# Patient Record
Sex: Female | Born: 1960 | Race: White | Hispanic: No | Marital: Married | Smoking: Former smoker
Health system: Southern US, Community
[De-identification: ages and names within clinical notes are randomized; demographics above are authoritative.]

## PROBLEM LIST (undated history)

## (undated) DIAGNOSIS — E782 Mixed hyperlipidemia: Secondary | ICD-10-CM

## (undated) DIAGNOSIS — I779 Disorder of arteries and arterioles, unspecified: Secondary | ICD-10-CM

## (undated) DIAGNOSIS — I10 Essential (primary) hypertension: Secondary | ICD-10-CM

## (undated) HISTORY — DX: Essential (primary) hypertension: I10

## (undated) HISTORY — DX: Mixed hyperlipidemia: E78.2

## (undated) HISTORY — DX: Disorder of arteries and arterioles, unspecified: I77.9

## (undated) HISTORY — PX: NASAL SEPTUM SURGERY: SHX37

---

## 2015-02-07 ENCOUNTER — Encounter (INDEPENDENT_AMBULATORY_CARE_PROVIDER_SITE_OTHER): Payer: Self-pay | Admitting: *Deleted

## 2015-03-02 ENCOUNTER — Telehealth (INDEPENDENT_AMBULATORY_CARE_PROVIDER_SITE_OTHER): Payer: Self-pay | Admitting: *Deleted

## 2015-03-02 ENCOUNTER — Other Ambulatory Visit (INDEPENDENT_AMBULATORY_CARE_PROVIDER_SITE_OTHER): Payer: Self-pay | Admitting: Internal Medicine

## 2015-03-02 ENCOUNTER — Ambulatory Visit (INDEPENDENT_AMBULATORY_CARE_PROVIDER_SITE_OTHER): Payer: BLUE CROSS/BLUE SHIELD | Admitting: Internal Medicine

## 2015-03-02 ENCOUNTER — Encounter (INDEPENDENT_AMBULATORY_CARE_PROVIDER_SITE_OTHER): Payer: Self-pay | Admitting: Internal Medicine

## 2015-03-02 VITALS — BP 104/60 | HR 76 | Temp 98.6°F | Ht 60.0 in | Wt 143.1 lb

## 2015-03-02 DIAGNOSIS — Z1211 Encounter for screening for malignant neoplasm of colon: Secondary | ICD-10-CM | POA: Diagnosis not present

## 2015-03-02 NOTE — Patient Instructions (Signed)
The risks and benefits such as perforation, bleeding, and infection were reviewed with the patient and is agreeable. 

## 2015-03-02 NOTE — Progress Notes (Signed)
   Subjective:    Patient ID: Kristy SarinCarolyn Clabaugh, female    DOB: [birth year], 54 y.o.   MRN: [id number]  HPI Referred to our office by Dr. Neita CarpSasser for scree Appetite is good. No weight loss. No abdominal pain.  No dysphagia. She usually has a BM daily. No melena or BRRB. No family hx of colon cancer. No NSAIDs She has never undergone a colonoscopy.    Review of Systems No past medical history on file.  Past Surgical History  Procedure Laterality Date  . Cesarean section      x 2  . Nasal septum surgery      Allergies  Allergen Reactions  . Shellfish Allergy     hives    No current outpatient prescriptions on file prior to visit.   No current facility-administered medications on file prior to visit.        Objective:   Physical ExamBlood pressure 104/60, pulse 76, temperature 98.6 F (37 C), height 5' (1.524 m), weight 143 lb 1.6 oz (64.91 kg). Alert and oriented. Skin warm and dry. Oral mucosa is moist.   . Sclera anicteric, conjunctivae is pink. Thyroid not enlarged. No cervical lymphadenopathy. Lungs clear. Heart regular rate and rhythm.  Abdomen is soft. Bowel sounds are positive. No hepatomegaly. No abdominal masses felt. No tenderness.  No edema to lower extremities.        Assessment & Plan:  Screening colonoscopy. No GI problems. The risks and benefits such as perforation, bleeding, and infection were reviewed with the patient and is agreeable.

## 2015-03-02 NOTE — Telephone Encounter (Signed)
Patient needs suprep 

## 2015-03-13 ENCOUNTER — Ambulatory Visit (INDEPENDENT_AMBULATORY_CARE_PROVIDER_SITE_OTHER): Payer: Self-pay | Admitting: Internal Medicine

## 2015-03-14 ENCOUNTER — Other Ambulatory Visit (INDEPENDENT_AMBULATORY_CARE_PROVIDER_SITE_OTHER): Payer: Self-pay | Admitting: Internal Medicine

## 2015-03-14 MED ORDER — NA SULFATE-K SULFATE-MG SULF 17.5-3.13-1.6 GM/177ML PO SOLN: 177.0000 mL | Freq: Once | ORAL | Status: DC

## 2015-03-14 NOTE — Telephone Encounter (Signed)
Ann resend this

## 2015-03-14 NOTE — Addendum Note (Signed)
Addended by: Luster LandsbergILLEY, Larence Thone H on: 03/14/2015 03:08 PM   Modules accepted: Orders

## 2015-03-17 ENCOUNTER — Telehealth (INDEPENDENT_AMBULATORY_CARE_PROVIDER_SITE_OTHER): Payer: Self-pay | Admitting: *Deleted

## 2015-03-17 DIAGNOSIS — Z1211 Encounter for screening for malignant neoplasm of colon: Secondary | ICD-10-CM

## 2015-03-17 MED ORDER — PEG 3350-KCL-NA BICARB-NACL 420 G PO SOLR: 4000.0000 mL | Freq: Once | ORAL | Status: DC

## 2015-03-17 NOTE — Telephone Encounter (Signed)
Patient needs trilyte, suprep too expensive 

## 2015-04-06 ENCOUNTER — Encounter (HOSPITAL_COMMUNITY): Payer: Self-pay | Admitting: *Deleted

## 2015-04-06 ENCOUNTER — Encounter (HOSPITAL_COMMUNITY)
Admission: RE | Disposition: A | Discharge status: Home / Self Care | Payer: Self-pay | Source: Ambulatory Visit | Attending: Internal Medicine

## 2015-04-06 ENCOUNTER — Ambulatory Visit (HOSPITAL_COMMUNITY)
Admission: RE | Admit: 2015-04-06 | Discharge: 2015-04-06 | Disposition: A | Discharge status: Home / Self Care | Payer: BLUE CROSS/BLUE SHIELD | Source: Ambulatory Visit | Attending: Internal Medicine | Admitting: Internal Medicine

## 2015-04-06 DIAGNOSIS — K648 Other hemorrhoids: Secondary | ICD-10-CM | POA: Diagnosis not present

## 2015-04-06 DIAGNOSIS — K644 Residual hemorrhoidal skin tags: Secondary | ICD-10-CM | POA: Diagnosis not present

## 2015-04-06 DIAGNOSIS — F17210 Nicotine dependence, cigarettes, uncomplicated: Secondary | ICD-10-CM | POA: Insufficient documentation

## 2015-04-06 DIAGNOSIS — Z8371 Family history of colonic polyps: Secondary | ICD-10-CM | POA: Diagnosis not present

## 2015-04-06 DIAGNOSIS — Z1211 Encounter for screening for malignant neoplasm of colon: Secondary | ICD-10-CM | POA: Insufficient documentation

## 2015-04-06 HISTORY — PX: COLONOSCOPY: SHX5424

## 2015-04-06 SURGERY — COLONOSCOPY
Anesthesia: Moderate Sedation

## 2015-04-06 MED ORDER — MIDAZOLAM HCL 5 MG/5ML IJ SOLN
INTRAMUSCULAR | Status: AC
  Filled 2015-04-06: qty 10

## 2015-04-06 MED ORDER — SODIUM CHLORIDE 0.9 % IV SOLN
INTRAVENOUS | Status: DC
  Administered 2015-04-06: 14:00:00 via INTRAVENOUS

## 2015-04-06 MED ORDER — MIDAZOLAM HCL 5 MG/5ML IJ SOLN
INTRAMUSCULAR | Status: DC | PRN
Start: 2015-04-06 — End: 2015-04-06
  Administered 2015-04-06: 2 mg via INTRAVENOUS
  Administered 2015-04-06: 1 mg via INTRAVENOUS
  Administered 2015-04-06 (×2): 2 mg via INTRAVENOUS

## 2015-04-06 MED ORDER — MEPERIDINE HCL 50 MG/ML IJ SOLN
INTRAMUSCULAR | Status: AC
  Filled 2015-04-06: qty 1

## 2015-04-06 MED ORDER — MEPERIDINE HCL 50 MG/ML IJ SOLN
INTRAMUSCULAR | Status: DC | PRN
  Administered 2015-04-06 (×2): 25 mg via INTRAVENOUS

## 2015-04-06 NOTE — Op Note (Signed)
COLONOSCOPY PROCEDURE REPORT  PATIENT:  Kristy Heath  MR#:  [id number] Birthdate:  1960/09/13, 54 y.o., female Endoscopist:  Dr. Malissa HippoNajeeb U. Ignatius Kloos, MD Referred By:  Dr. Estanislado PandyPaul W Sasser, MD  Procedure Date: 04/06/2015  Procedure:   Colonoscopy  Indications:  Patient is [age] old Caucasian female was undergoing average risk screening colonoscopy. Family history is negative for colorectal carcinoma. However 2 sisters and mother has had colonic polyps.  Informed Consent:  The procedure and risks were reviewed with the patient and informed consent was obtained.  Medications:  Demerol 50 mg IV Versed 7 mg IV  Description of procedure:  After a digital rectal exam was performed, that colonoscope was advanced from the anus through the rectum and colon to the area of the cecum, ileocecal valve and appendiceal orifice. The cecum was deeply intubated. These structures were well-seen and photographed for the record. From the level of the cecum and ileocecal valve, the scope was slowly and cautiously withdrawn. The mucosal surfaces were carefully surveyed utilizing scope tip to flexion to facilitate fold flattening as needed. The scope was pulled down into the rectum where a thorough exam including retroflexion was performed.  Findings:   Prep excellent. Normal mucosa of cecum, ascending colon, hepatic flexure, transverse colon, splenic flexure, descending and sigmoid colon. Normal rectal mucosa. Small hemorrhoids above and below the dentate line.   Therapeutic/Diagnostic Maneuvers Performed:   None  Complications:  None  EBL: None  Cecal Withdrawal Time:  9 minutes  Impression:  Examination performed to cecum. Internal and external hemorrhoids otherwise normal colonoscopy.  Recommendations:  Standard instructions given. Next colonoscopy in 5 years unless family history changes.  Camara Rosander U  04/06/2015 3:08 PM  CC: Dr. Estanislado PandySASSER,PAUL W, MD & Dr. Bonnetta BarryNo ref. provider found

## 2015-04-06 NOTE — Discharge Instructions (Signed)
Resume usual medications and diet. No driving for 24 hours. Next colonoscopy in 10 years unless family history changes.   Colonoscopy, Care After These instructions give you information on caring for yourself after your procedure. Your doctor may also give you more specific instructions. Call your doctor if you have any problems or questions after your procedure. HOME CARE  Do not drive for 24 hours.  Do not sign important papers or use machinery for 24 hours.  You may shower.  You may go back to your usual activities, but go slower for the first 24 hours.  Take rest breaks often during the first 24 hours.  Walk around or use warm packs on your belly (abdomen) if you have belly cramping or gas.  Drink enough fluids to keep your pee (urine) clear or pale yellow.  Resume your normal diet. Avoid heavy or fried foods.  Avoid drinking alcohol for 24 hours or as told by your doctor.  Only take medicines as told by your doctor. If a tissue sample (biopsy) was taken during the procedure:   Do not take aspirin or blood thinners for 7 days, or as told by your doctor.  Do not drink alcohol for 7 days, or as told by your doctor.  Eat soft foods for the first 24 hours. GET HELP IF: You still have a small amount of blood in your poop (stool) 2-3 days after the procedure. GET HELP RIGHT AWAY IF:  You have more than a small amount of blood in your poop.  You see clumps of tissue (blood clots) in your poop.  Your belly is puffy (swollen).  You feel sick to your stomach (nauseous) or throw up (vomit).  You have a fever.  You have belly pain that gets worse and medicine does not help. MAKE SURE YOU:  Understand these instructions.  Will watch your condition.  Will get help right away if you are not doing well or get worse.   This information is not intended to replace advice given to you by your health care provider. Make sure you discuss any questions you have with your health  care provider.   Document Released: 06/08/2010 Document Revised: 05/11/2013 Document Reviewed: 01/11/2013 Elsevier Interactive Patient Education [employer]2016 Elsevier Inc.

## 2015-04-06 NOTE — H&P (Signed)
Kristy SarinCarolyn Heath is an 54 y.o. female.   Chief Complaint: Patient is here for colonoscopy. HPI: This 54 year old Caucasian female who is here for screening colonoscopy. She denies abdominal pain change in bowel habits or rectal bleeding. Family history significant for colonic polyps in mother and 2 sisters. One sister is older and the other one is younger. Family history is negative for CRC.  History reviewed. No pertinent past medical history.  Past Surgical History  Procedure Laterality Date  . Cesarean section      x 2  . Nasal septum surgery      Family History  Problem Relation Age of Onset  . Heart disease Father   . Hypertension Brother    Social History:  reports that she has been smoking Cigarettes.  She has been smoking about 0.50 packs per day. She does not have any smokeless tobacco history on file. She reports that she does not drink alcohol or use illicit drugs.  Allergies:  Allergies  Allergen Reactions  . Shellfish Allergy     hives    Medications Prior to Admission  Medication Sig Dispense Refill  . cetirizine (ZYRTEC) 10 MG chewable tablet Chew 10 mg by mouth daily.    . Flaxseed, Linseed, (FLAX SEED OIL PO) Take by mouth.    Marland Kitchen. omeprazole (PRILOSEC) 20 MG capsule Take 20 mg by mouth daily.    . polyethylene glycol-electrolytes (NULYTELY/GOLYTELY) 420 G solution Take 4,000 mLs by mouth once. 4000 mL 0  . Red Yeast Rice Extract (RED YEAST RICE PO) Take by mouth.    . Na Sulfate-K Sulfate-Mg Sulf (SUPREP BOWEL PREP) SOLN Take 177 mLs by mouth once. 1171 mL 0    No results found for this or any previous visit (from the past 48 hour(s)). No results found.  ROS  Blood pressure 126/86, pulse 106, temperature 98.6 F (37 C), temperature source Oral, resp. rate 16, height 5' (1.524 m), weight 140 lb (63.504 kg), SpO2 100 %. Physical Exam  Constitutional: She appears well-developed and well-nourished.  HENT:  Mouth/Throat: Oropharynx is clear and moist.  Eyes:  Conjunctivae are normal. No scleral icterus.  Neck: No thyromegaly present.  Cardiovascular: Normal rate, regular rhythm and normal heart sounds.   No murmur heard. Respiratory: Effort normal and breath sounds normal.  GI: Soft. She exhibits no distension and no mass. There is no tenderness.  Musculoskeletal: She exhibits no edema.  Lymphadenopathy:    She has no cervical adenopathy.  Neurological: She is alert.  Skin: Skin is warm and dry.     Assessment/Plan Average risk screening colonoscopy. Family history of colonic polyps in 2 siblings and mother.  Kristy Heath U 04/06/2015, 2:36 PM

## 2015-04-07 ENCOUNTER — Encounter (INDEPENDENT_AMBULATORY_CARE_PROVIDER_SITE_OTHER): Payer: Self-pay

## 2015-04-12 ENCOUNTER — Encounter (HOSPITAL_COMMUNITY): Payer: Self-pay | Admitting: Internal Medicine

## 2016-05-02 ENCOUNTER — Encounter: Payer: Self-pay | Admitting: Vascular Surgery

## 2016-05-08 ENCOUNTER — Ambulatory Visit (INDEPENDENT_AMBULATORY_CARE_PROVIDER_SITE_OTHER): Payer: BLUE CROSS/BLUE SHIELD | Admitting: Vascular Surgery

## 2016-05-08 ENCOUNTER — Encounter: Payer: Self-pay | Admitting: Vascular Surgery

## 2016-05-08 VITALS — BP 132/84 | HR 88 | Temp 97.6°F | Resp 14 | Ht 60.0 in | Wt 133.0 lb

## 2016-05-08 DIAGNOSIS — R011 Cardiac murmur, unspecified: Secondary | ICD-10-CM

## 2016-05-08 DIAGNOSIS — I771 Stricture of artery: Secondary | ICD-10-CM | POA: Diagnosis not present

## 2016-05-08 NOTE — Progress Notes (Signed)
New Carotid Patient  Referred by:  Lianne Moris, PA-C [address] [place], [place] [address]  Reason for referral: left subclavian artery stenosis   History of Present Illness  Kristy Heath is a 55 y.o. (10-Dec-1960) female who presents with chief complaint: "some type of neck blockage".  This patient reported had a physical exam which resulted in discovery of a left side carotid bruit.  A carotid duplex was then obtained.  Previous carotid studies demonstrated: RICA <50% stenosis, LICA <50% stenosis, and partial retrograde flow in left vertebral artery.  Patient has no history of TIA or stroke symptom.  The patient has never had amaurosis fugax or monocular blindness.  The patient has never had facial drooping or hemiplegia.  The patient has never had receptive or expressive aphasia.   The patient denies any left arm claudication equivalent symptoms.  She does have occasional sx of left hand anesthesia after sleeping on that arm.  The patient's atherosclerotic risks factors include: reported HLD, active smoking.  Past Medical History: Tobacco abuse Hyperlipidemia Post-menopausal Left subclavian artery stenosis  Past Surgical History:  Procedure Laterality Date  . CESAREAN SECTION     x 2  . COLONOSCOPY N/A 04/06/2015   Procedure: COLONOSCOPY;  Surgeon: Malissa Hippo, MD;  [facility];  Service: Endoscopy;  Laterality: N/A;  1:00  . NASAL SEPTUM SURGERY      Social History   Social History  . Marital status: Married    Spouse name: N/A  . Number of children: N/A  . Years of education: N/A   Occupational History  . Not on file.   Social History Main Topics  . Smoking status: Former Smoker    Packs/day: 0.50    Types: Cigarettes    Quit date: 04/07/2016  . Smokeless tobacco: Never Used     Comment: less 1/2 pack x 20 yrs  . Alcohol use No  . Drug use: No  . Sexual activity: Not on file   Other Topics Concern  . Not on file   Social History Narrative  .  No narrative on file    Family History  Problem Relation Age of Onset  . Heart disease Father   . Hypertension Brother     Current Outpatient Prescriptions  Medication Sig Dispense Refill  . cetirizine (ZYRTEC) 10 MG chewable tablet Chew 10 mg by mouth daily.    . CHANTIX STARTING MONTH PAK 0.5 MG X 11 & 1 MG X 42 tablet     . Flaxseed, Linseed, (FLAX SEED OIL PO) Take by mouth.    Marland Kitchen omeprazole (PRILOSEC) 20 MG capsule Take 20 mg by mouth daily.    Marland Kitchen PREMARIN vaginal cream     . Red Yeast Rice Extract (RED YEAST RICE PO) Take by mouth.     No current facility-administered medications for this visit.     Allergies  Allergen Reactions  . Shellfish Allergy     hives     REVIEW OF SYSTEMS:   Cardiac:  positive for: no symptoms, negative for: Chest pain or chest pressure, Shortness of breath upon exertion and Shortness of breath when lying flat,   Vascular:  positive for: no symptoms,  negative for: Pain in calf, thigh, or hip brought on by ambulation, Pain in feet at night that wakes you up from your sleep, Blood clot in your veins and Leg swelling  Pulmonary:  positive for: no symptoms,  negative for: Oxygen at home, Productive cough and  Wheezing  Neurologic:  positive for: No symptoms, negative for: Sudden weakness in arms or legs, Sudden numbness in arms or legs, Sudden onset of difficulty speaking or slurred speech, Temporary loss of vision in one eye and Problems with dizziness  Gastrointestinal:  positive for: no symptoms, negative for: Blood in stool and Vomited blood  Genitourinary:  positive for: no symptoms, negative for: Burning when urinating and Blood in urine  Psychiatric:  positive for: no symptoms,  negative for: Major depression  Hematologic:  positive for: no symptoms,  negative for: negative for: Bleeding problems and Problems with blood clotting too easily  Dermatologic:  positive for: no symptoms, negative for: Rashes or  ulcers  Constitutional:  positive for: no symptoms, negative for: Fever or chills  Ear/Nose/Throat:  positive for: no symptoms, negative for: Change in hearing, Nose bleeds and Sore throat  Musculoskeletal:  positive for: no symptoms, negative for: Back pain, Joint pain and Muscle pain   Physical Examination  Vitals:   05/08/16 0927 05/08/16 0933  BP: 126/89 132/84  Pulse: 88 88  Resp: 14   Temp: 97.6 F (36.4 C)   SpO2: 100%   Weight: 133 lb (60.3 kg)   Height: 5' (1.524 m)     Body mass index is 25.97 kg/m.  General: Alert, O x 3, WD,NAD  Head: /AT,   Ear/Nose/Throat: Hearing grossly intact, nares without erythema or drainage, oropharynx without Erythema or Exudate , Mallampati score: 3, Dentition intact  Eyes: PERRLA, EOMI,   Neck: Supple, mid-line trachea,    Pulmonary: Sym exp, good B air movt,CTA B  Cardiac: RRR, Nl S1, S2, + murmur, No rubs, No S3,S4  Vascular: Vessel Right Left  Radial Palpable Palpable  Brachial Palpable Palpable  Carotid Palpable, No Bruit Palpable, No Bruit  Aorta Not palpable N/A  Femoral Palpable Palpable  Popliteal Not palpable Not palpable  PT Palpable Palpable  DP Palpable Palpable   Gastrointestinal: soft, non-distended, non-tender to palpation, No guarding or rebound, no HSM, no masses, no CVAT B, No palpable prominent aortic pulse,    Musculoskeletal: M/S 5/5 throughout  , Extremities without ischemic changes  , No edema present, No LDS present  Neurologic: CN 2-12 intact , Pain and light touch intact in extremities , Motor exam as listed above  Psychiatric: Judgement intact, Mood & affect appropriate for pt's clinical situation  Dermatologic: See M/S exam for extremity exam, No rashes otherwise noted  Lymph : Palpable lymph nodes: None   Outside Studies/Documentation 5 pages of outside documents were reviewed including: PCP charts and outside B carotid duplex   Medical Decision Making  Kristy Heath is  a 55 y.o. female who presents with: asx possible L SCA stenosis, heart murmur   The patient's L carotid murmur is likely a transmitted heart murmur.  I will defer further work-up on the heart murmur to her PCP.  The patient does not have lifestyle limiting sx so even if she has some degree of L SCA stenosis, no intervention is needed.  BP differentially is only 6 mm Hg between both arms.  Based on the patient's vascular studies and examination, I have offered the patient: repeat B carotid duplex in 6 months to see if flow reversal is seen in L vertebral artery.  I discussed in depth with the patient the nature of atherosclerosis, and emphasized the importance of maximal medical management including strict control of blood pressure, blood glucose, and lipid levels, obtaining regular exercise, antiplatelet agents, and cessation of smoking.  The patient is currently not on a statin.  She is going to resume her red yeast extract and follow up with her PCP to see if she needs to formally start a statin. The patient is currently starting ASA.  The patient is aware that without maximal medical management the underlying atherosclerotic disease process will progress, limiting the benefit of any interventions.  Thank you for allowing us to participate in this patient's care.   Leonides SakeBrian Jayden Kratochvil, MD, FACS [facility] Office: [phone] Pager: [phone]  05/08/2016, 11:57 AM

## 2016-05-08 NOTE — Addendum Note (Signed)
Addended by: Burton ApleyPETTY, Sandhya Denherder A on: 05/08/2016 02:59 PM   Modules accepted: Orders

## 2016-05-09 ENCOUNTER — Encounter: Payer: Self-pay | Admitting: Family Medicine

## 2016-07-29 ENCOUNTER — Ambulatory Visit (INDEPENDENT_AMBULATORY_CARE_PROVIDER_SITE_OTHER): Payer: BLUE CROSS/BLUE SHIELD | Admitting: Cardiovascular Disease

## 2016-07-29 ENCOUNTER — Encounter: Payer: Self-pay | Admitting: Cardiovascular Disease

## 2016-07-29 VITALS — BP 120/74 | HR 99 | Ht 60.0 in | Wt 138.0 lb

## 2016-07-29 DIAGNOSIS — Z716 Tobacco abuse counseling: Secondary | ICD-10-CM

## 2016-07-29 DIAGNOSIS — I779 Disorder of arteries and arterioles, unspecified: Secondary | ICD-10-CM

## 2016-07-29 DIAGNOSIS — I739 Peripheral vascular disease, unspecified: Secondary | ICD-10-CM

## 2016-07-29 DIAGNOSIS — R011 Cardiac murmur, unspecified: Secondary | ICD-10-CM

## 2016-07-29 DIAGNOSIS — I771 Stricture of artery: Secondary | ICD-10-CM | POA: Diagnosis not present

## 2016-07-29 NOTE — Progress Notes (Signed)
CARDIOLOGY CONSULT NOTE  Patient ID: Kristy Heath MRN: [id number] DOB/AGE: 58-Aug-1962 58 y.o.  Admit date: (Not on file) Primary Physician: Estanislado Pandy, MD Referring Physician:   Reason for Consultation: subclavian artery stenosis  HPI: The patient is a 56 yr old woman with a history of tobacco abuse referred for the evaluation of a "murmur" and possible left subclavian artery stenosis.She underwent carotid artery Dopplers at [facility] on 04/09/16. This revealed less than 50% bilateral internal carotid artery stenosis. There was reversible of left vertebral artery flow with suggestion of significant left subclavian artery stenosis.  She then saw Dr. Imogene Burn, a vascular surgeon in [place] on 05/08/16. He reviewed the findings and noted that given her absence of symptoms and only a 6 mmHg discrepancy in blood pressure between both arms, there was no need for immediate intervention and ultrasound could be repeated in 6 months.  She then started taking aspirin daily. She takes red rice yeast extract.  She denies a history of myocardial infarction and stroke. She denies exertional chest pain and shortness of breath. She does not exercise. She has smoked a half pack of cigarettes daily since she was a teenager. She denies left arm pain and weakness. When she gardens and bends down and stands up too quickly she might get dizzy on occasion.  ECG performed in the office today which I personally reviewed demonstrates normal sinus rhythm with no ischemic ST segment or T-wave abnormalities, nor any arrhythmias.     Allergies  Allergen Reactions  . Shellfish Allergy     hives    Current Outpatient Prescriptions  Medication Sig Dispense Refill  . aspirin 81 MG chewable tablet Chew by mouth daily.    . cetirizine (ZYRTEC) 10 MG chewable tablet Chew 10 mg by mouth daily.    . Flaxseed, Linseed, (FLAX SEED OIL PO) Take by mouth.    Marland Kitchen omeprazole (PRILOSEC) 20 MG capsule  Take 20 mg by mouth daily.    . Red Yeast Rice Extract (RED YEAST RICE PO) Take by mouth.     No current facility-administered medications for this visit.     History reviewed. No pertinent past medical history.  Past Surgical History:  Procedure Laterality Date  . CESAREAN SECTION     x 2  . COLONOSCOPY N/A 04/06/2015   Procedure: COLONOSCOPY;  Surgeon: Malissa Hippo, MD;  [facility];  Service: Endoscopy;  Laterality: N/A;  1:00  . NASAL SEPTUM SURGERY      Social History   Social History  . Marital status: Married    Spouse name: N/A  . Number of children: N/A  . Years of education: N/A   Occupational History  . Not on file.   Social History Main Topics  . Smoking status: Former Smoker    Packs/day: 0.50    Types: Cigarettes    Start date: 07/29/1976  . Smokeless tobacco: Never Used     Comment: less 1/2 pack x 20 yrs  . Alcohol use No  . Drug use: No  . Sexual activity: Not on file   Other Topics Concern  . Not on file   Social History Narrative  . No narrative on file     No family history of premature CAD in 1st degree relatives.  Prior to Admission medications   Medication Sig Start Date End Date Taking? Authorizing Provider  aspirin 81 MG chewable tablet Chew by mouth daily.   Yes Historical Provider,  MD  cetirizine (ZYRTEC) 10 MG chewable tablet Chew 10 mg by mouth daily.   Yes Historical Provider, MD  Flaxseed, Linseed, (FLAX SEED OIL PO) Take by mouth.   Yes Historical Provider, MD  omeprazole (PRILOSEC) 20 MG capsule Take 20 mg by mouth daily.   Yes Historical Provider, MD  Red Yeast Rice Extract (RED YEAST RICE PO) Take by mouth.   Yes Historical Provider, MD     Review of systems complete and found to be negative unless listed above in HPI     Physical exam Blood pressure 120/74, pulse 99, height 5' (1.524 m), weight 138 lb (62.6 kg), SpO2 99 %. General: NAD Neck: No JVD, no thyromegaly or thyroid nodule.  Lungs: Clear to  auscultation bilaterally with normal respiratory effort. CV: Nondisplaced PMI. Regular rate and rhythm, normal S1/S2, no S3/S4, no murmur.  No peripheral edema.  Bilateral carotid bruits, left > right. +left suclavian bruit (left infraclavicular area).  Normal pedal pulses.  Abdomen: Soft, nontender, no distention.  Skin: Intact without lesions or rashes.  Neurologic: Alert and oriented x 3.  Psych: Normal affect. Extremities: No clubbing or cyanosis.  HEENT: Normal.   ECG: Most recent ECG reviewed.  Telemetry: Independently reviewed.  Labs:  No results found for: WBC, HGB, HCT, MCV, PLT No results for input(s): NA, K, CL, CO2, BUN, CREATININE, CALCIUM, PROT, BILITOT, ALKPHOS, ALT, AST, GLUCOSE in the last 168 hours.  Invalid input(s): LABALBU No results found for: CKTOTAL, CKMB, CKMBINDEX, TROPONINI No results found for: CHOL No results found for: HDL No results found for: LDLCALC No results found for: TRIG No results found for: CHOLHDL No results found for: LDLDIRECT       Studies: No results found.  ASSESSMENT AND PLAN:  1. Left subclavian artery stenosis: She is asymptomatic and denies left arm pain or weakness. She denies a history of chest pain and stroke. There was only a 6 mmHg discrepancy in blood pressures between both arms. I offered her the option of pursuing a CT angiogram to further characterize this lesion. I also told her she could follow up with vascular surgery at which time they could repeat an ultrasound if deemed necessary. She will think about it. She will continue aspirin 81 mg daily.  2. Bilateral carotid artery stenosis: This was mild with less than 50% bilateral internal artery stenosis. She is on aspirin and red rice yeast extract. This can be repeated in one or 2 years.  3. Tobacco abuse: Cessation counseling given for primary prevention of CV disease. She had been on Chantix in the past. (2 minutes).  Dispo: fu prn.   Signed: Prentice DockerSuresh Karron Goens,  M.D., F.A.C.C.  07/29/2016, 1:52 PM

## 2016-07-29 NOTE — Patient Instructions (Signed)
Your physician recommends that you schedule a follow-up appointment in: as needed with Dr Purvis SheffieldKoneswaran   Your physician recommends that you continue on your current medications as directed. Please refer to the Current Medication list given to you today.     Thank you for choosing [facility] !

## 2016-11-15 ENCOUNTER — Encounter (HOSPITAL_COMMUNITY): Payer: BLUE CROSS/BLUE SHIELD

## 2016-11-15 ENCOUNTER — Ambulatory Visit: Payer: BLUE CROSS/BLUE SHIELD | Admitting: Vascular Surgery

## 2016-12-10 ENCOUNTER — Encounter: Payer: Self-pay | Admitting: Vascular Surgery

## 2016-12-23 NOTE — Progress Notes (Signed)
Established Subclavian Patient   History of Present Illness   Kristy Heath is a 56 y.o. (January 09, 1961) female who presents with chief complaint: none.  Previous carotid studies demonstrated: RICA <50% stenosis, LICA 50% stenosis, and partial flow reversal in L [place].  Patient has no history of TIA or stroke symptom.  The patient has never had amaurosis fugax or monocular blindness.  The patient has never had facial drooping or hemiplegia.  The patient has never had receptive or expressive aphasia.    The patient denies any left arm claudication equivalent symptoms.  She does note asx hand grip strength, being very right hand dominant.  She does not have any sx that interfere with ADL or work.  The patient's PMH, PSH, SH, and FamHx are unchanged from 05/08/16.   Current Outpatient Prescriptions  Medication Sig Dispense Refill  . aspirin 81 MG chewable tablet Chew by mouth daily.    . cetirizine (ZYRTEC) 10 MG chewable tablet Chew 10 mg by mouth daily.    . Flaxseed, Linseed, (FLAX SEED OIL PO) Take by mouth.    Marland Kitchen omeprazole (PRILOSEC) 20 MG capsule Take 20 mg by mouth daily.    . Red Yeast Rice Extract (RED YEAST RICE PO) Take by mouth.     No current facility-administered medications for this visit.     On ROS today: no arm numbness, no changed in ability to execute ADL or work   Physical Examination   Vitals:   12/27/16 1117 12/27/16 1118  BP: 123/79 101/80  Pulse: 89   Resp: 18   Temp: 98.6 F (37 C)   TempSrc: Oral   SpO2: 97%   Weight: 132 lb 3.2 oz (60 kg)   Height: 5' (1.524 m)    Body mass index is 25.82 kg/m.  General Alert, O x 3, WD, NAD  Neck Supple, mid-line trachea,    Pulmonary Sym exp, good B air movt, CTA B  Cardiac RRR, Nl S1, S2, no Murmurs, No rubs, No S3,S4  Vascular Vessel Right Left  Radial Palpable Faintly palpable  Brachial Palpable Faintly palpable  Carotid Palpable, No Bruit Palpable, Bruit present: likely transmitted heart murmur    Aorta Not palpable N/A  Femoral Palpable Palpable  Popliteal Not palpable Not palpable  PT Palpable Palpable  DP Palpable Palpable    Gastro- intestinal soft, non-distended, non-tender to palpation, No guarding or rebound, no HSM, no masses, no CVAT B, No palpable prominent aortic pulse,    Musculo- skeletal M/S 5/5 throughout  , Extremities without ischemic changes    Neurologic Cranial nerves 2-12 intact , Pain and light touch intact in extremities , Motor exam as listed above    Non-Invasive Vascular Imaging   B Carotid Duplex (12/27/2016):   R ICA stenosis:  1-39%  R VA: patent and antegrade  L ICA stenosis:  1-39%  L VA: patent and bidirectional   Medical Decision Making   Kristy Heath is a 56 y.o. female who presents with: B asx ICA stenosis <50%, asx L SCA stenosis without any signs of vertebrobasilar disease   Today's gradiet 21 mm Hg between R and L arm, suggesting progression of L SCA stenosis given prior 6 mm Hg.  However, patient remains asx, so it is hard to justify intervention given the variable patency of subclavian artery stenting.  This patient has NO interest in considering subclavian-carotid transposition or bypass.  Based on the patient's vascular studies and examination, I have offered the patient: annual carotid  duplex.  I discussed in depth with the patient the nature of atherosclerosis, and emphasized the importance of maximal medical management including strict control of blood pressure, blood glucose, and lipid levels, antiplatelet agents, obtaining regular exercise, and cessation of smoking.    The patient is aware that without maximal medical management the underlying atherosclerotic disease process will progress, limiting the benefit of any interventions. The patient is currently taking red yeast in place of formal statin.  We discussed considering starting formal statin. The patient is currently on an anti-platelet: ASA.  Thank you for  allowing us to participate in this patient's care.   Leonides SakeBrian Addley Ballinger, MD, FACS [facility] [place] Office: [phone] Pager: [phone]

## 2016-12-27 ENCOUNTER — Encounter: Payer: Self-pay | Admitting: Vascular Surgery

## 2016-12-27 ENCOUNTER — Ambulatory Visit (HOSPITAL_COMMUNITY)
Admission: RE | Admit: 2016-12-27 | Discharge: 2016-12-27 | Disposition: A | Discharge status: Home / Self Care | Payer: BLUE CROSS/BLUE SHIELD | Source: Ambulatory Visit | Attending: Vascular Surgery | Admitting: Vascular Surgery

## 2016-12-27 ENCOUNTER — Ambulatory Visit (INDEPENDENT_AMBULATORY_CARE_PROVIDER_SITE_OTHER): Payer: BLUE CROSS/BLUE SHIELD | Admitting: Vascular Surgery

## 2016-12-27 VITALS — BP 101/80 | HR 89 | Temp 98.6°F | Resp 18 | Ht 60.0 in | Wt 132.2 lb

## 2016-12-27 DIAGNOSIS — I6523 Occlusion and stenosis of bilateral carotid arteries: Secondary | ICD-10-CM | POA: Insufficient documentation

## 2016-12-27 DIAGNOSIS — I771 Stricture of artery: Secondary | ICD-10-CM | POA: Diagnosis present

## 2016-12-27 LAB — VAS US CAROTID
LCCADDIAS: -31 cm/s
LEFT ECA DIAS: -29 cm/s
LICADSYS: -61 cm/s
Left CCA dist sys: -104 cm/s
Left CCA prox dias: 25 cm/s
Left CCA prox sys: 108 cm/s
Left ICA dist dias: -24 cm/s
Left ICA prox dias: -19 cm/s
Left ICA prox sys: -63 cm/s
RIGHT CCA MID DIAS: 20 cm/s
RIGHT ECA DIAS: -17 cm/s
RIGHT VERTEBRAL DIAS: -22 cm/s
Right CCA prox dias: 24 cm/s
Right CCA prox sys: 101 cm/s
Right cca dist sys: -64 cm/s

## 2017-01-08 NOTE — Addendum Note (Signed)
Addended by: Burton Apley A on: 01/08/2017 11:35 AM   Modules accepted: Orders

## 2017-10-03 DIAGNOSIS — K210 Gastro-esophageal reflux disease with esophagitis: Secondary | ICD-10-CM | POA: Diagnosis not present

## 2017-10-03 DIAGNOSIS — E7800 Pure hypercholesterolemia, unspecified: Secondary | ICD-10-CM | POA: Diagnosis not present

## 2017-10-03 DIAGNOSIS — E782 Mixed hyperlipidemia: Secondary | ICD-10-CM | POA: Diagnosis not present

## 2017-10-07 DIAGNOSIS — R0989 Other specified symptoms and signs involving the circulatory and respiratory systems: Secondary | ICD-10-CM | POA: Diagnosis not present

## 2017-10-07 DIAGNOSIS — K210 Gastro-esophageal reflux disease with esophagitis: Secondary | ICD-10-CM | POA: Diagnosis not present

## 2017-10-07 DIAGNOSIS — E782 Mixed hyperlipidemia: Secondary | ICD-10-CM | POA: Diagnosis not present

## 2017-10-07 DIAGNOSIS — Z1389 Encounter for screening for other disorder: Secondary | ICD-10-CM | POA: Diagnosis not present

## 2018-04-01 ENCOUNTER — Other Ambulatory Visit: Payer: Self-pay

## 2018-04-01 DIAGNOSIS — I771 Stricture of artery: Secondary | ICD-10-CM

## 2018-04-07 ENCOUNTER — Ambulatory Visit: Payer: BLUE CROSS/BLUE SHIELD | Admitting: Vascular Surgery

## 2018-04-07 ENCOUNTER — Encounter (HOSPITAL_COMMUNITY): Payer: BLUE CROSS/BLUE SHIELD

## 2018-04-09 DIAGNOSIS — Z0000 Encounter for general adult medical examination without abnormal findings: Secondary | ICD-10-CM | POA: Diagnosis not present

## 2018-04-13 DIAGNOSIS — M542 Cervicalgia: Secondary | ICD-10-CM | POA: Diagnosis not present

## 2018-04-13 DIAGNOSIS — R0989 Other specified symptoms and signs involving the circulatory and respiratory systems: Secondary | ICD-10-CM | POA: Diagnosis not present

## 2018-04-13 DIAGNOSIS — Z6826 Body mass index (BMI) 26.0-26.9, adult: Secondary | ICD-10-CM | POA: Diagnosis not present

## 2018-04-13 DIAGNOSIS — M79671 Pain in right foot: Secondary | ICD-10-CM | POA: Diagnosis not present

## 2018-04-13 DIAGNOSIS — E782 Mixed hyperlipidemia: Secondary | ICD-10-CM | POA: Diagnosis not present

## 2018-04-13 DIAGNOSIS — Z23 Encounter for immunization: Secondary | ICD-10-CM | POA: Diagnosis not present

## 2018-04-13 DIAGNOSIS — Z0000 Encounter for general adult medical examination without abnormal findings: Secondary | ICD-10-CM | POA: Diagnosis not present

## 2018-06-01 ENCOUNTER — Telehealth (HOSPITAL_COMMUNITY): Payer: Self-pay | Admitting: Surgery

## 2018-06-01 NOTE — Telephone Encounter (Signed)
Attempted to contact patient to confirm appointments for 06/02/2018

## 2018-06-02 ENCOUNTER — Encounter: Payer: Self-pay | Admitting: Vascular Surgery

## 2018-06-02 ENCOUNTER — Other Ambulatory Visit: Payer: Self-pay

## 2018-06-02 ENCOUNTER — Ambulatory Visit (HOSPITAL_COMMUNITY)
Admission: RE | Admit: 2018-06-02 | Discharge: 2018-06-02 | Disposition: A | Discharge status: Home / Self Care | Payer: Commercial Managed Care - PPO | Source: Ambulatory Visit | Attending: Vascular Surgery | Admitting: Vascular Surgery

## 2018-06-02 ENCOUNTER — Ambulatory Visit (INDEPENDENT_AMBULATORY_CARE_PROVIDER_SITE_OTHER): Payer: Commercial Managed Care - PPO | Admitting: Vascular Surgery

## 2018-06-02 VITALS — BP 128/85 | HR 86 | Resp 16 | Ht 60.0 in | Wt 132.0 lb

## 2018-06-02 DIAGNOSIS — I771 Stricture of artery: Secondary | ICD-10-CM | POA: Diagnosis not present

## 2018-06-02 NOTE — Progress Notes (Signed)
Patient name: Kristy Heath MRN: [id number] DOB: Feb 21, 1961 Sex: female  REASON FOR VISIT: Follow-up for carotid disease  HPI: Analyce Heath is a 58 y.o. female that presents for one-year follow-up for carotid disease.  She states Dr. Imogene Burn started following her about 3 to 4 years ago after her primary care doctor noticed a left carotid bruit.  On follow-up today she reports no issues over the last year including no TIA or strokelike symptoms.  She states she has never had a stroke in the past.  She does take an aspirin daily.  In addition reviewing Dr. Nicky Pugh notes there was some concern about subclavian artery disease of her left upper extremity due to a difference in blood pressure.  She denies any arm claudication, left arm pain, tissue loss, and/or passing out to suggest steal symptoms.  She does smoke 1/2 - 1 PPD.  History reviewed. No pertinent past medical history.  Past Surgical History:  Procedure Laterality Date  . CESAREAN SECTION     x 2  . COLONOSCOPY N/A 04/06/2015   Procedure: COLONOSCOPY;  Surgeon: Malissa Hippo, MD;  Location[facility];  Service: Endoscopy;  Laterality: N/A;  1:00  . NASAL SEPTUM SURGERY      Family History  Problem Relation Age of Onset  . Heart disease Father   . Hypertension Brother     SOCIAL HISTORY: Social History   Tobacco Use  . Smoking status: Former Smoker    Packs/day: 0.50    Types: Cigarettes    Start date: 07/29/1976  . Smokeless tobacco: Never Used  . Tobacco comment: less 1/2 pack x 20 yrs  Substance Use Topics  . Alcohol use: No    Alcohol/week: 0.0 standard drinks    Allergies  Allergen Reactions  . Shellfish Allergy     hives    Current Outpatient Medications  Medication Sig Dispense Refill  . aspirin 81 MG chewable tablet Chew by mouth daily.    . cetirizine (ZYRTEC) 10 MG chewable tablet Chew 10 mg by mouth daily.    Marland Kitchen conjugated estrogens (PREMARIN) vaginal cream Place 1 Applicatorful vaginally daily.      . Flaxseed, Linseed, (FLAX SEED OIL PO) Take by mouth.    . levothyroxine (SYNTHROID, LEVOTHROID) 50 MCG tablet Take 50 mcg by mouth daily before breakfast.    . omeprazole (PRILOSEC) 20 MG capsule Take 20 mg by mouth daily.    . Red Yeast Rice Extract (RED YEAST RICE PO) Take by mouth.     No current facility-administered medications for this visit.     REVIEW OF SYSTEMS:  [X]  denotes positive finding, [ ]  denotes negative finding Cardiac  Comments:  Chest pain or chest pressure:    Shortness of breath upon exertion:    Short of breath when lying flat:    Irregular heart rhythm:        Vascular    Pain in calf, thigh, or hip brought on by ambulation:    Pain in feet at night that wakes you up from your sleep:     Blood clot in your veins:    Leg swelling:         Pulmonary    Oxygen at home:    Productive cough:     Wheezing:         Neurologic    Sudden weakness in arms or legs:     Sudden numbness in arms or legs:     Sudden onset of difficulty  speaking or slurred speech:    Temporary loss of vision in one eye:     Problems with dizziness:         Gastrointestinal    Blood in stool:     Vomited blood:         Genitourinary    Burning when urinating:     Blood in urine:        Psychiatric    Major depression:         Hematologic    Bleeding problems:    Problems with blood clotting too easily:        Skin    Rashes or ulcers:        Constitutional    Fever or chills:      PHYSICAL EXAM: Vitals:   06/02/18 1521 06/02/18 1522  BP: (!) 141/90 128/85  Pulse: 86   Resp: 16   Weight: 132 lb (59.9 kg)   Height: 5' (1.524 m)     GENERAL: The patient is a well-nourished female, in no acute distress. The vital signs are documented above. CARDIAC: There is a regular rate and rhythm.  VASCULAR:  PULMONARY: There is good air exchange bilaterally without wheezing or rales. ABDOMEN: Soft and non-tender with normal pitched bowel sounds.  MUSCULOSKELETAL:  There are no major deformities or cyanosis. NEUROLOGIC: No focal weakness or paresthesias are detected. CN II-XII grossly intact. SKIN: There are no ulcers or rashes noted. PSYCHIATRIC: The patient has a normal affect.  DATA:   I reviewed her carotid duplex that shows no significant stenosis.  Assessment/Plan:  58 year old female previously followed by Dr. Imogene Burn for less than 50% bilateral carotid disease and concern for left subclavian stenosis vs occlusion.  She has had no significant progression of carotid disease on her duplex today and remains asymptomatic.  In addition, other than slightly weaker left radial pulse (compared to right) - no indication for intervention on previously suspected subclavian pathology (previously had partial flow reversal) -- antegrade flow in left vertebral artery on duplex today.  Will repeat duplex in one year and discussed importance of smoking cessation.    Cephus Shelling, MD [facility] Office: [phone] Pager: [phone]

## 2018-06-19 DIAGNOSIS — Z1231 Encounter for screening mammogram for malignant neoplasm of breast: Secondary | ICD-10-CM | POA: Diagnosis not present

## 2018-10-05 DIAGNOSIS — E782 Mixed hyperlipidemia: Secondary | ICD-10-CM | POA: Diagnosis not present

## 2018-10-13 ENCOUNTER — Other Ambulatory Visit (HOSPITAL_COMMUNITY): Payer: Self-pay | Admitting: Family Medicine

## 2018-10-13 ENCOUNTER — Other Ambulatory Visit: Payer: Self-pay | Admitting: Internal Medicine

## 2018-10-13 ENCOUNTER — Other Ambulatory Visit: Payer: Self-pay | Admitting: Family Medicine

## 2018-10-13 DIAGNOSIS — Z720 Tobacco use: Secondary | ICD-10-CM

## 2018-10-13 DIAGNOSIS — F17210 Nicotine dependence, cigarettes, uncomplicated: Secondary | ICD-10-CM

## 2018-10-27 ENCOUNTER — Other Ambulatory Visit: Payer: Self-pay

## 2018-10-27 ENCOUNTER — Ambulatory Visit (HOSPITAL_COMMUNITY)
Admission: RE | Admit: 2018-10-27 | Discharge: 2018-10-27 | Disposition: A | Discharge status: Home / Self Care | Payer: Commercial Managed Care - PPO | Source: Ambulatory Visit | Attending: Family Medicine | Admitting: Family Medicine

## 2018-10-27 DIAGNOSIS — Z720 Tobacco use: Secondary | ICD-10-CM | POA: Diagnosis present

## 2018-10-27 DIAGNOSIS — F17210 Nicotine dependence, cigarettes, uncomplicated: Secondary | ICD-10-CM | POA: Diagnosis present

## 2020-03-15 ENCOUNTER — Encounter (INDEPENDENT_AMBULATORY_CARE_PROVIDER_SITE_OTHER): Payer: Self-pay | Admitting: *Deleted

## 2021-08-13 ENCOUNTER — Other Ambulatory Visit (HOSPITAL_COMMUNITY): Payer: Self-pay | Admitting: Family Medicine

## 2021-08-13 DIAGNOSIS — Z1231 Encounter for screening mammogram for malignant neoplasm of breast: Secondary | ICD-10-CM

## 2021-08-22 ENCOUNTER — Ambulatory Visit (HOSPITAL_COMMUNITY)
Admission: RE | Admit: 2021-08-22 | Discharge: 2021-08-22 | Disposition: A | Discharge status: Home / Self Care | Payer: 59 | Source: Ambulatory Visit | Attending: Family Medicine | Admitting: Family Medicine

## 2021-08-22 DIAGNOSIS — Z1231 Encounter for screening mammogram for malignant neoplasm of breast: Secondary | ICD-10-CM | POA: Diagnosis not present

## 2022-04-25 DIAGNOSIS — E782 Mixed hyperlipidemia: Secondary | ICD-10-CM | POA: Diagnosis not present

## 2022-04-25 DIAGNOSIS — Z0001 Encounter for general adult medical examination with abnormal findings: Secondary | ICD-10-CM | POA: Diagnosis not present

## 2022-04-25 DIAGNOSIS — E7800 Pure hypercholesterolemia, unspecified: Secondary | ICD-10-CM | POA: Diagnosis not present

## 2022-04-25 DIAGNOSIS — E039 Hypothyroidism, unspecified: Secondary | ICD-10-CM | POA: Diagnosis not present

## 2022-04-25 DIAGNOSIS — K2100 Gastro-esophageal reflux disease with esophagitis, without bleeding: Secondary | ICD-10-CM | POA: Diagnosis not present

## 2022-05-21 DIAGNOSIS — Z23 Encounter for immunization: Secondary | ICD-10-CM | POA: Diagnosis not present

## 2022-05-24 ENCOUNTER — Inpatient Hospital Stay (HOSPITAL_COMMUNITY)
Admission: AC | Admit: 2022-05-24 | Discharge: 2022-05-27 | DRG: 322 | Disposition: A | Discharge status: Home / Self Care | Payer: 59 | Source: Other Acute Inpatient Hospital | Attending: Cardiovascular Disease | Admitting: Cardiovascular Disease

## 2022-05-24 ENCOUNTER — Encounter (HOSPITAL_COMMUNITY)
Admission: AC | Disposition: A | Discharge status: Home / Self Care | Payer: Self-pay | Source: Other Acute Inpatient Hospital | Attending: Internal Medicine

## 2022-05-24 DIAGNOSIS — R0600 Dyspnea, unspecified: Secondary | ICD-10-CM | POA: Diagnosis not present

## 2022-05-24 DIAGNOSIS — I069 Rheumatic aortic valve disease, unspecified: Secondary | ICD-10-CM | POA: Diagnosis present

## 2022-05-24 DIAGNOSIS — R079 Chest pain, unspecified: Secondary | ICD-10-CM | POA: Diagnosis not present

## 2022-05-24 DIAGNOSIS — E785 Hyperlipidemia, unspecified: Secondary | ICD-10-CM | POA: Diagnosis not present

## 2022-05-24 DIAGNOSIS — R69 Illness, unspecified: Secondary | ICD-10-CM | POA: Diagnosis not present

## 2022-05-24 DIAGNOSIS — R0789 Other chest pain: Secondary | ICD-10-CM | POA: Diagnosis not present

## 2022-05-24 DIAGNOSIS — Z743 Need for continuous supervision: Secondary | ICD-10-CM | POA: Diagnosis not present

## 2022-05-24 DIAGNOSIS — Z7982 Long term (current) use of aspirin: Secondary | ICD-10-CM | POA: Diagnosis not present

## 2022-05-24 DIAGNOSIS — T148XXA Other injury of unspecified body region, initial encounter: Secondary | ICD-10-CM | POA: Diagnosis not present

## 2022-05-24 DIAGNOSIS — Z8249 Family history of ischemic heart disease and other diseases of the circulatory system: Secondary | ICD-10-CM

## 2022-05-24 DIAGNOSIS — I25110 Atherosclerotic heart disease of native coronary artery with unstable angina pectoris: Secondary | ICD-10-CM | POA: Diagnosis not present

## 2022-05-24 DIAGNOSIS — I213 ST elevation (STEMI) myocardial infarction of unspecified site: Secondary | ICD-10-CM | POA: Diagnosis present

## 2022-05-24 DIAGNOSIS — F17210 Nicotine dependence, cigarettes, uncomplicated: Secondary | ICD-10-CM | POA: Diagnosis present

## 2022-05-24 DIAGNOSIS — I6523 Occlusion and stenosis of bilateral carotid arteries: Secondary | ICD-10-CM | POA: Diagnosis present

## 2022-05-24 DIAGNOSIS — R0689 Other abnormalities of breathing: Secondary | ICD-10-CM | POA: Diagnosis not present

## 2022-05-24 DIAGNOSIS — Z716 Tobacco abuse counseling: Secondary | ICD-10-CM | POA: Diagnosis not present

## 2022-05-24 DIAGNOSIS — S5011XA Contusion of right forearm, initial encounter: Secondary | ICD-10-CM | POA: Diagnosis not present

## 2022-05-24 DIAGNOSIS — Z87891 Personal history of nicotine dependence: Secondary | ICD-10-CM | POA: Diagnosis not present

## 2022-05-24 DIAGNOSIS — I959 Hypotension, unspecified: Secondary | ICD-10-CM | POA: Diagnosis not present

## 2022-05-24 DIAGNOSIS — I708 Atherosclerosis of other arteries: Secondary | ICD-10-CM | POA: Diagnosis not present

## 2022-05-24 DIAGNOSIS — Z91013 Allergy to seafood: Secondary | ICD-10-CM

## 2022-05-24 DIAGNOSIS — Z79890 Hormone replacement therapy (postmenopausal): Secondary | ICD-10-CM | POA: Diagnosis not present

## 2022-05-24 DIAGNOSIS — R110 Nausea: Secondary | ICD-10-CM | POA: Diagnosis not present

## 2022-05-24 DIAGNOSIS — Y840 Cardiac catheterization as the cause of abnormal reaction of the patient, or of later complication, without mention of misadventure at the time of the procedure: Secondary | ICD-10-CM | POA: Diagnosis not present

## 2022-05-24 DIAGNOSIS — I2119 ST elevation (STEMI) myocardial infarction involving other coronary artery of inferior wall: Principal | ICD-10-CM | POA: Diagnosis present

## 2022-05-24 DIAGNOSIS — Z79899 Other long term (current) drug therapy: Secondary | ICD-10-CM | POA: Diagnosis not present

## 2022-05-24 DIAGNOSIS — I2111 ST elevation (STEMI) myocardial infarction involving right coronary artery: Secondary | ICD-10-CM

## 2022-05-24 DIAGNOSIS — I2510 Atherosclerotic heart disease of native coronary artery without angina pectoris: Secondary | ICD-10-CM | POA: Diagnosis not present

## 2022-05-24 DIAGNOSIS — E7800 Pure hypercholesterolemia, unspecified: Secondary | ICD-10-CM | POA: Diagnosis not present

## 2022-05-24 DIAGNOSIS — M79603 Pain in arm, unspecified: Secondary | ICD-10-CM | POA: Diagnosis not present

## 2022-05-24 DIAGNOSIS — Z955 Presence of coronary angioplasty implant and graft: Secondary | ICD-10-CM

## 2022-05-24 DIAGNOSIS — I2583 Coronary atherosclerosis due to lipid rich plaque: Secondary | ICD-10-CM | POA: Diagnosis not present

## 2022-05-24 DIAGNOSIS — Z720 Tobacco use: Secondary | ICD-10-CM | POA: Insufficient documentation

## 2022-05-24 DIAGNOSIS — R230 Cyanosis: Secondary | ICD-10-CM | POA: Diagnosis not present

## 2022-05-24 HISTORY — PX: LEFT HEART CATH AND CORONARY ANGIOGRAPHY: CATH118249

## 2022-05-24 HISTORY — PX: CORONARY/GRAFT ACUTE MI REVASCULARIZATION: CATH118305

## 2022-05-24 LAB — POCT I-STAT, CHEM 8
BUN: 13 mg/dL (ref 8–23)
Calcium, Ion: 1.07 mmol/L — ABNORMAL LOW (ref 1.15–1.40)
Chloride: 98 mmol/L (ref 98–111)
Creatinine, Ser: 0.5 mg/dL (ref 0.44–1.00)
Glucose, Bld: 120 mg/dL — ABNORMAL HIGH (ref 70–99)
HCT: 32 % — ABNORMAL LOW (ref 36.0–46.0)
Hemoglobin: 10.9 g/dL — ABNORMAL LOW (ref 12.0–15.0)
Potassium: 3.2 mmol/L — ABNORMAL LOW (ref 3.5–5.1)
Sodium: 130 mmol/L — ABNORMAL LOW (ref 135–145)
TCO2: 19 mmol/L — ABNORMAL LOW (ref 22–32)

## 2022-05-24 SURGERY — CORONARY/GRAFT ACUTE MI REVASCULARIZATION
Anesthesia: LOCAL

## 2022-05-24 MED ORDER — SODIUM CHLORIDE 0.9 % IV SOLN: INTRAVENOUS | Status: AC

## 2022-05-24 MED ORDER — ASPIRIN 81 MG PO CHEW
81.0000 mg | CHEWABLE_TABLET | Freq: Every day | ORAL | Status: DC
  Administered 2022-05-25 – 2022-05-26 (×2): 81 mg via ORAL
  Filled 2022-05-24 (×2): qty 1

## 2022-05-24 MED ORDER — SODIUM CHLORIDE 0.9 % IV SOLN
INTRAVENOUS | Status: AC | PRN
  Administered 2022-05-24: 250 mL via INTRAVENOUS

## 2022-05-24 MED ORDER — TIROFIBAN HCL IN NACL 5-0.9 MG/100ML-% IV SOLN
0.1500 ug/kg/min | INTRAVENOUS | Status: DC
  Filled 2022-05-24: qty 100

## 2022-05-24 MED ORDER — VERAPAMIL HCL 2.5 MG/ML IV SOLN
INTRAVENOUS | Status: AC
  Filled 2022-05-24: qty 2

## 2022-05-24 MED ORDER — TIROFIBAN HCL IN NACL 5-0.9 MG/100ML-% IV SOLN
INTRAVENOUS | Status: AC | PRN
  Administered 2022-05-24: .15 ug/kg/min via INTRAVENOUS

## 2022-05-24 MED ORDER — SODIUM CHLORIDE 0.9% FLUSH
3.0000 mL | Freq: Two times a day (BID) | INTRAVENOUS | Status: DC
  Administered 2022-05-25 – 2022-05-26 (×4): 3 mL via INTRAVENOUS

## 2022-05-24 MED ORDER — NOREPINEPHRINE 4 MG/250ML-% IV SOLN
5.0000 ug/min | INTRAVENOUS | Status: DC
  Administered 2022-05-24: 5 ug/min via INTRAVENOUS

## 2022-05-24 MED ORDER — FENTANYL CITRATE (PF) 100 MCG/2ML IJ SOLN
INTRAMUSCULAR | Status: DC | PRN
  Administered 2022-05-24: 25 ug via INTRAVENOUS

## 2022-05-24 MED ORDER — HYDRALAZINE HCL 20 MG/ML IJ SOLN: 10.0000 mg | INTRAMUSCULAR | Status: AC | PRN

## 2022-05-24 MED ORDER — TIROFIBAN (AGGRASTAT) BOLUS VIA INFUSION
INTRAVENOUS | Status: DC | PRN
  Administered 2022-05-24: 1587.5 ug via INTRAVENOUS

## 2022-05-24 MED ORDER — ATORVASTATIN CALCIUM 80 MG PO TABS
80.0000 mg | ORAL_TABLET | Freq: Every day | ORAL | Status: DC
  Administered 2022-05-25 – 2022-05-26 (×2): 80 mg via ORAL
  Filled 2022-05-24 (×2): qty 1

## 2022-05-24 MED ORDER — NOREPINEPHRINE 4 MG/250ML-% IV SOLN
INTRAVENOUS | Status: AC
  Filled 2022-05-24: qty 250

## 2022-05-24 MED ORDER — NITROGLYCERIN 0.4 MG SL SUBL: 0.4000 mg | SUBLINGUAL_TABLET | SUBLINGUAL | Status: DC | PRN

## 2022-05-24 MED ORDER — IOHEXOL 350 MG/ML SOLN
INTRAVENOUS | Status: DC | PRN
  Administered 2022-05-24: 122 mL

## 2022-05-24 MED ORDER — SODIUM CHLORIDE 0.9% FLUSH: 3.0000 mL | INTRAVENOUS | Status: DC | PRN

## 2022-05-24 MED ORDER — TIROFIBAN HCL IN NACL 5-0.9 MG/100ML-% IV SOLN
INTRAVENOUS | Status: AC
  Filled 2022-05-24: qty 100

## 2022-05-24 MED ORDER — LEVOTHYROXINE SODIUM 50 MCG PO TABS
50.0000 ug | ORAL_TABLET | Freq: Every day | ORAL | Status: DC
  Administered 2022-05-25 – 2022-05-27 (×3): 50 ug via ORAL
  Filled 2022-05-24 (×3): qty 1

## 2022-05-24 MED ORDER — TICAGRELOR 90 MG PO TABS
ORAL_TABLET | ORAL | Status: AC
  Filled 2022-05-24: qty 2

## 2022-05-24 MED ORDER — TICAGRELOR 90 MG PO TABS
ORAL_TABLET | ORAL | Status: DC | PRN
  Administered 2022-05-24: 180 mg via ORAL

## 2022-05-24 MED ORDER — TICAGRELOR 90 MG PO TABS
90.0000 mg | ORAL_TABLET | Freq: Two times a day (BID) | ORAL | Status: DC
  Administered 2022-05-25 – 2022-05-27 (×5): 90 mg via ORAL
  Filled 2022-05-24 (×5): qty 1

## 2022-05-24 MED ORDER — NOREPINEPHRINE BITARTRATE 1 MG/ML IV SOLN
INTRAVENOUS | Status: AC | PRN
  Administered 2022-05-24: 10 ug/min via INTRAVENOUS

## 2022-05-24 MED ORDER — MIDAZOLAM HCL 2 MG/2ML IJ SOLN
INTRAMUSCULAR | Status: AC
  Filled 2022-05-24: qty 2

## 2022-05-24 MED ORDER — MIDAZOLAM HCL 2 MG/2ML IJ SOLN
INTRAMUSCULAR | Status: DC | PRN
  Administered 2022-05-24: 1 mg via INTRAVENOUS

## 2022-05-24 MED ORDER — ATROPINE SULFATE 1 MG/10ML IJ SOSY
PREFILLED_SYRINGE | INTRAMUSCULAR | Status: DC | PRN
  Administered 2022-05-24: .5 mg via INTRAVENOUS

## 2022-05-24 MED ORDER — HEPARIN (PORCINE) IN NACL 1000-0.9 UT/500ML-% IV SOLN
INTRAVENOUS | Status: AC
  Filled 2022-05-24: qty 500

## 2022-05-24 MED ORDER — LABETALOL HCL 5 MG/ML IV SOLN: 10.0000 mg | INTRAVENOUS | Status: AC | PRN

## 2022-05-24 MED ORDER — HEPARIN (PORCINE) IN NACL 1000-0.9 UT/500ML-% IV SOLN
INTRAVENOUS | Status: DC | PRN
  Administered 2022-05-24 (×2): 500 mL

## 2022-05-24 MED ORDER — HEPARIN SODIUM (PORCINE) 1000 UNIT/ML IJ SOLN
INTRAMUSCULAR | Status: AC
  Filled 2022-05-24: qty 10

## 2022-05-24 MED ORDER — ACETAMINOPHEN 325 MG PO TABS: 650.0000 mg | ORAL_TABLET | ORAL | Status: DC | PRN

## 2022-05-24 MED ORDER — SODIUM CHLORIDE 0.9 % IV SOLN: 250.0000 mL | INTRAVENOUS | Status: DC | PRN

## 2022-05-24 MED ORDER — HEPARIN (PORCINE) IN NACL 2-0.9 UNITS/ML
INTRAMUSCULAR | Status: DC | PRN
  Administered 2022-05-24: 10 mL via INTRA_ARTERIAL

## 2022-05-24 MED ORDER — HEPARIN SODIUM (PORCINE) 1000 UNIT/ML IJ SOLN
INTRAMUSCULAR | Status: DC | PRN
  Administered 2022-05-24: 3000 [IU] via INTRAVENOUS
  Administered 2022-05-24: 5000 [IU] via INTRAVENOUS
  Administered 2022-05-24 (×3): 2000 [IU] via INTRAVENOUS

## 2022-05-24 MED ORDER — ONDANSETRON HCL 4 MG/2ML IJ SOLN: 4.0000 mg | Freq: Four times a day (QID) | INTRAMUSCULAR | Status: DC | PRN

## 2022-05-24 MED ORDER — ATROPINE SULFATE 1 MG/10ML IJ SOSY
PREFILLED_SYRINGE | INTRAMUSCULAR | Status: AC
  Filled 2022-05-24: qty 10

## 2022-05-24 MED ORDER — FENTANYL CITRATE (PF) 100 MCG/2ML IJ SOLN
INTRAMUSCULAR | Status: AC
  Filled 2022-05-24: qty 2

## 2022-05-24 MED ORDER — MORPHINE SULFATE (PF) 2 MG/ML IV SOLN: 1.0000 mg | INTRAVENOUS | Status: DC | PRN

## 2022-05-24 MED ORDER — PANTOPRAZOLE SODIUM 40 MG PO TBEC
40.0000 mg | DELAYED_RELEASE_TABLET | Freq: Every day | ORAL | Status: DC
  Administered 2022-05-25 – 2022-05-26 (×2): 40 mg via ORAL
  Filled 2022-05-24 (×2): qty 1

## 2022-05-24 SURGICAL SUPPLY — 28 items
BALLN EMERGE MR 3.0X12 (BALLOONS) ×1
BALLN SAPPHIRE 2.5X12 (BALLOONS) ×1
BALLN ~~LOC~~ EMERGE MR 2.75X20 (BALLOONS) ×1
BALLN ~~LOC~~ EMERGE MR 3.0X12 (BALLOONS) ×1
BALLOON EMERGE MR 3.0X12 (BALLOONS) IMPLANT
BALLOON SAPPHIRE 2.5X12 (BALLOONS) IMPLANT
BALLOON ~~LOC~~ EMERGE MR 2.75X20 (BALLOONS) IMPLANT
BALLOON ~~LOC~~ EMERGE MR 3.0X12 (BALLOONS) IMPLANT
CATH DIAG 6FR PIGTAIL ANGLED (CATHETERS) IMPLANT
CATH INFINITI 6F FL3.5 (CATHETERS) IMPLANT
CATH LAUNCHER 6FR JR4 (CATHETERS) IMPLANT
CATH TELESCOPE 6F GEC (CATHETERS) IMPLANT
DEVICE RAD COMP TR BAND LRG (VASCULAR PRODUCTS) IMPLANT
GLIDESHEATH SLEND SS 6F .021 (SHEATH) IMPLANT
GUIDEWIRE VAS SION BLUE 190 (WIRE) IMPLANT
KIT ENCORE 26 ADVANTAGE (KITS) IMPLANT
KIT HEART LEFT (KITS) ×1 IMPLANT
PACK CARDIAC CATHETERIZATION (CUSTOM PROCEDURE TRAY) ×1 IMPLANT
STENT SYNERGY XD 2.50X38 (Permanent Stent) IMPLANT
STENT SYNERGY XD 3.0X12 (Permanent Stent) IMPLANT
SYNERGY XD 2.50X38 (Permanent Stent) ×1 IMPLANT
SYNERGY XD 3.0X12 (Permanent Stent) ×1 IMPLANT
TRANSDUCER W/STOPCOCK (MISCELLANEOUS) ×1 IMPLANT
TUBING CIL FLEX 10 FLL-RA (TUBING) ×1 IMPLANT
VALVE GUARDIAN II ~~LOC~~ HEMO (MISCELLANEOUS) IMPLANT
WIRE ASAHI PROWATER 180CM (WIRE) IMPLANT
WIRE EMERALD 3MM-J .035X260CM (WIRE) IMPLANT
WIRE HI TORQ WHISPER MS 190CM (WIRE) IMPLANT

## 2022-05-24 NOTE — H&P (Signed)
Cardiology Admission History and Physical   Patient ID: Kristy Heath MRN: [id number]; DOB: 08-21-1960   Admission date: 05/24/2022  PCP:  Manon Hilding, MD   [place] Providers Cardiologist:  None        Chief Complaint:  STEMI  Patient Profile:   Kristy Heath is a 62 y.o. female with PMH Carotid stenosis ( RICA <29% stenosis, LICA 79% stenosis, and partial flow reversal in L [place]), ?left subclavian stenosis, HLD  who is being seen 05/24/2022 for the evaluation of chest pain- Inferior STEMI.  History of Present Illness:   [name] is a 62 y.o. female with PMH Carotid stenosis ( RICA <89% stenosis, LICA 21% stenosis, and partial flow reversal in L [place]), ?left subclavian stenosis, HLD  who is being seen 05/24/2022 for the evaluation of chest pain- Inferior STEMI.  She presented to [facility] for chest pain, sweating, dizziness and not feeling well. Earlier today she did elipitical for 6-32mins and then started having weird feeling in the chest- which she cannot describe, then noted to have nausea, chest pain, sweating- didn't feel right and became dizzy as well- did not pass out. Given her symptoms- they went to [facility] where EKG showed inferior STEMI- activated and brought directly to the CATH lab  Patient was on Norepi when she came in. At OSH received aspirin, morphine, nitro and heparin gtt. CATH lab- s/p RCA stent x2, has mid LAD- staged PCI on Monday. Had distal RCA-PLB mild thrombus for which she was started on aggrastat Norepi- has been weaned to 46mcg  At the time of my visit- she was chest pain free, BP 108/70s EKG shows inferior ST elevation- persisting.   Current tobacco use:1/2 ppd FH: significant for premature CAD- father with MI, and multiple family members  No past medical history on file.  Past Surgical History:  Procedure Laterality Date   CESAREAN SECTION     x 2   COLONOSCOPY N/A 04/06/2015   Procedure: COLONOSCOPY;  Surgeon:  Rogene Houston, MD;  Location: [facility];  Service: Endoscopy;  Laterality: N/A;  1:00   NASAL SEPTUM SURGERY       Medications Prior to Admission: Prior to Admission medications   Medication Sig Start Date End Date Taking? Authorizing Provider  aspirin 81 MG chewable tablet Chew by mouth daily.    [provider]  cetirizine (ZYRTEC) 10 MG chewable tablet Chew 10 mg by mouth daily.    [provider]  conjugated estrogens (PREMARIN) vaginal cream Place 1 Applicatorful vaginally daily.    [provider]  Flaxseed, Linseed, (FLAX SEED OIL PO) Take by mouth.    [provider]  levothyroxine (SYNTHROID, LEVOTHROID) 50 MCG tablet Take 50 mcg by mouth daily before breakfast.    [provider]  omeprazole (PRILOSEC) 20 MG capsule Take 20 mg by mouth daily.    [provider]  Red Yeast Rice Extract (RED YEAST RICE PO) Take by mouth.    [provider]     Allergies:    Allergies  Allergen Reactions   Shellfish Allergy     hives    Social History:   Social History   Socioeconomic History   Marital status: Married    Spouse name: Not on file   Number of children: Not on file   Years of education: Not on file   Highest education level: Not on file  Occupational History   Not on file  Tobacco Use  Smoking status: Former    Packs/day: 0.50    Types: Cigarettes    Start date: 07/29/1976   Smokeless tobacco: Never   Tobacco comments:    less 1/2 pack x 20 yrs  Substance and Sexual Activity   Alcohol use: No    Alcohol/week: 0.0 standard drinks of alcohol   Drug use: No   Sexual activity: Not on file  Other Topics Concern   Not on file  Social History Narrative   Not on file   Social Determinants of Health   Financial Resource Strain: Not on file  Food Insecurity: Not on file  Transportation Needs: Not on file  Physical Activity: Not on file  Stress: Not on file  Social Connections: Not on file   Intimate Partner Violence: Not on file    Family History:   The patient's family history includes Heart disease in her father; Hypertension in her brother.    ROS:  Please see the history of present illness.  All other ROS reviewed and negative.     Physical Exam/Data:   Vitals:   05/24/22 2110 05/24/22 2135 05/24/22 2145 05/24/22 2200  BP: 107/74 109/82 138/88 134/85  Pulse: (!) 0 88 97 92  Resp:  14 15 14   Temp:    (!) 97.5 F (36.4 C)  TempSrc:    Oral  SpO2: 95% 99% 98% 99%  Weight:  60 kg      Intake/Output Summary (Last 24 hours) at 05/24/2022 2209 Last data filed at 05/24/2022 2200 Gross per 24 hour  Intake 810.19 ml  Output --  Net 810.19 ml      05/24/2022    9:35 PM 06/02/2018    3:21 PM 12/27/2016   11:17 AM  Last 3 Weights  Weight (lbs) 132 lb 4.4 oz 132 lb 132 lb 3.2 oz  Weight (kg) 60 kg 59.875 kg 59.966 kg     Body mass index is 25.83 kg/m.  General:  Well nourished, well developed, in no acute distress HEENT: normal Neck: no JVD Vascular: No carotid bruits; Distal pulses 2+ bilaterally   Cardiac:  normal S1, S2; RRR; no murmur  Lungs:  clear to auscultation bilaterally, no wheezing, rhonchi or rales  Abd: soft, nontender, no hepatomegaly  Ext: no edema Right arm- TR band in place Musculoskeletal:  No deformities, BUE and BLE strength normal and equal Skin: warm and dry  Neuro:  CNs 2-12 intact, no focal abnormalities noted Psych:  Normal affect    EKG:  inferior STEMI  Relevant CV Studies: -  Laboratory Data:  High Sensitivity Troponin:  No results for input(s): "TROPONINIHS" in the last 720 hours.    Chemistry Recent Labs  Lab 05/24/22 2015  NA 130*  K 3.2*  CL 98  GLUCOSE 120*  BUN 13  CREATININE 0.50    No results for input(s): "PROT", "ALBUMIN", "AST", "ALT", "ALKPHOS", "BILITOT" in the last 168 hours. Lipids No results for input(s): "CHOL", "TRIG", "HDL", "LABVLDL", "LDLCALC", "CHOLHDL" in the last 168  hours. Hematology Recent Labs  Lab 05/24/22 2015  HGB 10.9*  HCT 32.0*   Thyroid No results for input(s): "TSH", "FREET4" in the last 168 hours. BNPNo results for input(s): "BNP", "PROBNP" in the last 168 hours.  DDimer No results for input(s): "DDIMER" in the last 168 hours.   Radiology/Studies:  CARDIAC CATHETERIZATION  Result Date: 05/24/2022   Prox RCA lesion is 100% stenosed.   Mid LAD lesion is 85% stenosed.   A stent was successfully  placed.   Post intervention, there is a 0% residual stenosis. 1.  100% occluded proximal right coronary artery treated with 2 overlapping drug-eluting stents; due to distal embolization to the distal aspect of the RPLV system the patient will be treated with Aggrastat for 12 hours along with pain control. 2.  Residual high-grade mid LAD lesion will be staged in the coming days. 3.  LVEDP of 12 mmHg. Recommendation: Aspirin, Brilinta, and hold beta-blockade for at least 24 hours.  Continue Aggrastat for 18 hours.  Staged PCI of mid LAD on Monday.     Assessment and Plan:   Inferior STEMI 2. HLD 3. Carotid stenosis <50% carotid stenosis (RICA <46% stenosis, LICA 28% stenosis, and partial flow reversal in L [place]) Tobacco use Dx  Plan: - underwent emergent LHC: noted to have prox RCA occlusion s/p stenting/PCI. Has some distal PLB low flow- will treat with aggrastat IV for 18 hours - has residual mid LAD dx- will need staged PCI on Monday - continue DAPT: aspirin plus brilinta, atorva 80mg  daily.  Holding off on metoprolol- will initiate GDMT - Weaning Norepi- currently on 30mcg - Cardiac rehab referral and aggressive risk factor modification- quit smoking - check labs, A1c, lipid panel - Admit to Critical care -TR band monitoring - ECHO in am  Full code D/w Dr Ali Lowe  Risk Assessment/Risk Scores:    TIMI Risk Score for ST  Elevation MI:   The patient's TIMI risk score is 5, which indicates a 12.4% risk of all cause mortality at 30 days.         Severity of Illness: The appropriate patient status for this patient is INPATIENT. Inpatient status is judged to be reasonable and necessary in order to provide the required intensity of service to ensure the patient's safety. The patient's presenting symptoms, physical exam findings, and initial radiographic and laboratory data in the context of their chronic comorbidities is felt to place them at high risk for further clinical deterioration. Furthermore, it is not anticipated that the patient will be medically stable for discharge from the hospital within 2 midnights of admission.   * I certify that at the point of admission it is my clinical judgment that the patient will require inpatient hospital care spanning beyond 2 midnights from the point of admission due to high intensity of service, high risk for further deterioration and high frequency of surveillance required.*   For questions or updates, please contact Will Please consult [email or web address] for contact info under     Signed, Renae Fickle, MD  05/24/2022 10:09 PM

## 2022-05-25 ENCOUNTER — Inpatient Hospital Stay (HOSPITAL_COMMUNITY): Payer: 59

## 2022-05-25 DIAGNOSIS — I213 ST elevation (STEMI) myocardial infarction of unspecified site: Secondary | ICD-10-CM

## 2022-05-25 DIAGNOSIS — T148XXA Other injury of unspecified body region, initial encounter: Secondary | ICD-10-CM

## 2022-05-25 DIAGNOSIS — I25110 Atherosclerotic heart disease of native coronary artery with unstable angina pectoris: Secondary | ICD-10-CM

## 2022-05-25 DIAGNOSIS — Z716 Tobacco abuse counseling: Secondary | ICD-10-CM

## 2022-05-25 DIAGNOSIS — E785 Hyperlipidemia, unspecified: Secondary | ICD-10-CM

## 2022-05-25 LAB — BASIC METABOLIC PANEL
Anion gap: 9 (ref 5–15)
BUN: 10 mg/dL (ref 8–23)
CO2: 20 mmol/L — ABNORMAL LOW (ref 22–32)
Calcium: 8.2 mg/dL — ABNORMAL LOW (ref 8.9–10.3)
Chloride: 109 mmol/L (ref 98–111)
Creatinine, Ser: 0.75 mg/dL (ref 0.44–1.00)
GFR, Estimated: >60 mL/min (ref >60)
Glucose, Bld: 113 mg/dL — ABNORMAL HIGH (ref 70–99)
Potassium: 3.8 mmol/L (ref 3.5–5.1)
Sodium: 138 mmol/L (ref 135–145)

## 2022-05-25 LAB — ECHOCARDIOGRAM COMPLETE
AR max vel: 1.32 cm2
AV Area VTI: 1.38 cm2
AV Area mean vel: 1.38 cm2
AV Mean grad: 6 mmHg
AV Peak grad: 11.3 mmHg
Ao pk vel: 1.68 m/s
Area-P 1/2: 3.42 cm2
S' Lateral: 3.2 cm
Weight: 2116.42 oz

## 2022-05-25 LAB — CBC WITH DIFFERENTIAL/PLATELET
Abs Immature Granulocytes: 0.04 10*3/uL (ref 0.00–0.07)
Basophils Absolute: 0.1 10*3/uL (ref 0.0–0.1)
Basophils Relative: 1 %
Eosinophils Absolute: 0 10*3/uL (ref 0.0–0.5)
Eosinophils Relative: 0 %
HCT: 29.7 % — ABNORMAL LOW (ref 36.0–46.0)
Hemoglobin: 9.7 g/dL — ABNORMAL LOW (ref 12.0–15.0)
Immature Granulocytes: 1 %
Lymphocytes Relative: 22 %
Lymphs Abs: 1.9 10*3/uL (ref 0.7–4.0)
MCH: 30.6 pg (ref 26.0–34.0)
MCHC: 32.7 g/dL (ref 30.0–36.0)
MCV: 93.7 fL (ref 80.0–100.0)
Monocytes Absolute: 0.7 10*3/uL (ref 0.1–1.0)
Monocytes Relative: 9 %
Neutro Abs: 5.8 10*3/uL (ref 1.7–7.7)
Neutrophils Relative %: 67 %
Platelets: 275 10*3/uL (ref 150–400)
RBC: 3.17 MIL/uL — ABNORMAL LOW (ref 3.87–5.11)
RDW: 12.9 % (ref 11.5–15.5)
WBC: 8.5 10*3/uL (ref 4.0–10.5)
nRBC: 0 % (ref 0.0–0.2)

## 2022-05-25 LAB — MRSA NEXT GEN BY PCR, NASAL: MRSA by PCR Next Gen: NOT DETECTED

## 2022-05-25 LAB — LIPID PANEL
Cholesterol: 147 mg/dL (ref 0–200)
HDL: 30 mg/dL — ABNORMAL LOW (ref >40)
LDL Cholesterol: 56 mg/dL (ref 0–99)
Total CHOL/HDL Ratio: 4.9 RATIO
Triglycerides: 307 mg/dL — ABNORMAL HIGH (ref <150)
VLDL: 61 mg/dL — ABNORMAL HIGH (ref 0–40)

## 2022-05-25 LAB — HIV ANTIBODY (ROUTINE TESTING W REFLEX): HIV Screen 4th Generation wRfx: NONREACTIVE

## 2022-05-25 MED ORDER — CHLORHEXIDINE GLUCONATE CLOTH 2 % EX PADS
6.0000 | MEDICATED_PAD | Freq: Every day | CUTANEOUS | Status: DC
  Administered 2022-05-25 – 2022-05-26 (×2): 6 via TOPICAL

## 2022-05-25 MED ORDER — HEPARIN SODIUM (PORCINE) 5000 UNIT/ML IJ SOLN
5000.0000 [IU] | Freq: Three times a day (TID) | INTRAMUSCULAR | Status: DC
  Administered 2022-05-25 – 2022-05-27 (×5): 5000 [IU] via SUBCUTANEOUS
  Filled 2022-05-25 (×5): qty 1

## 2022-05-25 MED ORDER — TIROFIBAN HCL IN NACL 5-0.9 MG/100ML-% IV SOLN: 0.1500 ug/kg/min | INTRAVENOUS | Status: AC

## 2022-05-25 MED ORDER — SODIUM CHLORIDE 0.9% FLUSH
3.0000 mL | Freq: Two times a day (BID) | INTRAVENOUS | Status: DC
  Administered 2022-05-25 – 2022-05-26 (×3): 3 mL via INTRAVENOUS

## 2022-05-25 NOTE — Progress Notes (Signed)
RN reported that right hand/arm developed hematoma proximal to the TR band when he it was de-aired, and then patient developed petechiae, cyanosis of right arm.  I evaluated the patient at bedside.  Blood pressure stable, nor epinephrine down to 1 mcg -Right hand is cold, there is small hematoma proximal to TR band and ecchymosis and cyanosis of right arm -Doppler triphasic flow both proximal and distal right radial, collateral flow from ulnaris visible.  -Patient had already put cuff pressure for 20 minutes and also held manual pressure to decrease hematoma  -We moved 1 cc air from TR band -Will slowly remove air every hour, and watch for hematoma. Patient is currently on Aggrastat which complicates things.  If hematoma gets worse and I will stop Aggrastat.  Close monitoring on petechie, hopefully it will resolve once TR band area is removed.

## 2022-05-25 NOTE — Progress Notes (Signed)
Rounding Note    Patient Name: Kristy Heath Date of Encounter: 05/25/2022  [facility] Cardiologist: Orbie Pyo, MD   Subjective   Reviewed overnight events, has cath with PCI, developed R wrist hematoma at radial access site. Overnight had cyanosis, this AM has significant hematoma and petechiae but warm, sensation and perfusion intact.  Inpatient Medications    Scheduled Meds:  aspirin  81 mg Oral Daily   atorvastatin  80 mg Oral Daily   Chlorhexidine Gluconate Cloth  6 each Topical Daily   levothyroxine  50 mcg Oral QAC breakfast   pantoprazole  40 mg Oral Daily   sodium chloride flush  3 mL Intravenous Q12H   ticagrelor  90 mg Oral BID   Continuous Infusions:  sodium chloride     norepinephrine (LEVOPHED) Adult infusion Stopped (05/25/22 0720)   tirofiban 0.15 mcg/kg/min (05/25/22 0900)   PRN Meds: sodium chloride, acetaminophen, morphine injection, nitroGLYCERIN, ondansetron (ZOFRAN) IV, sodium chloride flush   Vital Signs    Vitals:   05/25/22 0645 05/25/22 0746 05/25/22 0800 05/25/22 0900  BP: 110/66  116/75 94/69  Pulse: 82  85 83  Resp: 12  13 14   Temp:  98.5 F (36.9 C)    TempSrc:  Oral    SpO2: 100%  98% 99%  Weight:        Intake/Output Summary (Last 24 hours) at 05/25/2022 1017 Last data filed at 05/25/2022 0900 Gross per 24 hour  Intake 2055.71 ml  Output 800 ml  Net 1255.71 ml      05/24/2022    9:35 PM 06/02/2018    3:21 PM 12/27/2016   11:17 AM  Last 3 Weights  Weight (lbs) 132 lb 4.4 oz 132 lb 132 lb 3.2 oz  Weight (kg) 60 kg 59.875 kg 59.966 kg      Telemetry    NSR with occasional ectopy - Personally Reviewed  ECG    05/25/22: NSR, inferior ST elevations resolved, inferior T wave inversions present - Personally Reviewed  Physical Exam   GEN: No acute distress.   Neck: No JVD Cardiac: RRR, no murmurs, rubs, or gallops.  Vascular: r radial site with distal hand petechiae, but normal capillary refill, normal  sensation, warm, palpable pulse. Proximal R forearm with firm hematoma nearly to elbow Respiratory: Clear to auscultation bilaterally. GI: Soft, nontender, non-distended  MS: No edema; No deformity. Neuro:  Nonfocal  Psych: Normal affect   Labs    High Sensitivity Troponin:  No results for input(s): "TROPONINIHS" in the last 720 hours.   Chemistry Recent Labs  Lab 05/24/22 2015 05/25/22 0615  NA 130* 138  K 3.2* 3.8  CL 98 109  CO2  --  20*  GLUCOSE 120* 113*  BUN 13 10  CREATININE 0.50 0.75  CALCIUM  --  8.2*  GFRNONAA  --  >60  ANIONGAP  --  9    Lipids  Recent Labs  Lab 05/25/22 0615  CHOL 147  TRIG 307*  HDL 30*  LDLCALC 56  CHOLHDL 4.9    Hematology Recent Labs  Lab 05/24/22 2015 05/25/22 0615  WBC  --  8.5  RBC  --  3.17*  HGB 10.9* 9.7*  HCT 32.0* 29.7*  MCV  --  93.7  MCH  --  30.6  MCHC  --  32.7  RDW  --  12.9  PLT  --  275   Thyroid No results for input(s): "TSH", "FREET4" in the last 168 hours.  BNPNo results for input(s): "BNP", "PROBNP" in the last 168 hours.  DDimer No results for input(s): "DDIMER" in the last 168 hours.   Radiology    CARDIAC CATHETERIZATION  Addendum Date: 05/25/2022     Prox RCA lesion is 100% stenosed.   Mid LAD lesion is 85% stenosed.   A stent was successfully placed.   Post intervention, there is a 0% residual stenosis. 1.  100% occluded proximal right coronary artery treated with 2 overlapping drug-eluting stents; due to distal embolization to the distal aspect of the RPLV system the patient will be treated with Aggrastat for 18 hours along with pain control. 2.  Residual high-grade mid LAD lesion will be staged in the coming days. 3.  LVEDP of 12 mmHg. Recommendation: Aspirin, Brilinta, and hold beta-blockade until norepinephrine weaned off.  Continue Aggrastat for 18 hours.  Staged PCI of mid LAD on Monday.  Addendum Date: 05/25/2022     Prox RCA lesion is 100% stenosed.   Mid LAD lesion is 85% stenosed.   A stent  was successfully placed.   Post intervention, there is a 0% residual stenosis. 1.  100% occluded proximal right coronary artery treated with 2 overlapping drug-eluting stents; due to distal embolization to the distal aspect of the RPLV system the patient will be treated with Aggrastat for 18 hours along with pain control. 2.  Residual high-grade mid LAD lesion will be staged in the coming days. 3.  LVEDP of 12 mmHg. Recommendation: Aspirin, Brilinta, and hold beta-blockade for at least 24 hours.  Continue Aggrastat for 18 hours.  Staged PCI of mid LAD on Monday.  Result Date: 05/25/2022   Prox RCA lesion is 100% stenosed.   Mid LAD lesion is 85% stenosed.   A stent was successfully placed.   Post intervention, there is a 0% residual stenosis. 1.  100% occluded proximal right coronary artery treated with 2 overlapping drug-eluting stents; due to distal embolization to the distal aspect of the RPLV system the patient will be treated with Aggrastat for 12 hours along with pain control. 2.  Residual high-grade mid LAD lesion will be staged in the coming days. 3.  LVEDP of 12 mmHg. Recommendation: Aspirin, Brilinta, and hold beta-blockade for at least 24 hours.  Continue Aggrastat for 18 hours.  Staged PCI of mid LAD on Monday.    Cardiac Studies   Cath 05/24/22   Prox RCA lesion is 100% stenosed.   Mid LAD lesion is 85% stenosed.   A stent was successfully placed.   Post intervention, there is a 0% residual stenosis.   1.  100% occluded proximal right coronary artery treated with 2 overlapping drug-eluting stents; due to distal embolization to the distal aspect of the RPLV system the patient will be treated with Aggrastat for 18 hours along with pain control. 2.  Residual high-grade mid LAD lesion will be staged in the coming days. 3.  LVEDP of 12 mmHg.   Recommendation: Aspirin, Brilinta, and hold beta-blockade until norepinephrine weaned off.  Continue Aggrastat for 18 hours.  Staged PCI of mid LAD on  Monday.  Patient Profile     62 y.o. female with PMH carotid stenosis, hyperlipidemia, current tobacco use, family history of CAD who was brought to cath lab 05/24/22 for inferior STEMI from [facility]. Course complicated by radial hematoma at access site while on aggrastat.  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 Was on red yeast rice prior to admission, started on atorvastatin this admission  Hypotension requiring IV pressors -weaning down norepinephrine  Radial hematoma after procedure -was on aggrastat at the time. Aggrastat now resumed -Hgb 10.9 on presentation, 9.7 this AM. Plts 275 -continue to monitor  CRITICAL CARE Patient is critically ill and requires high complexity decision making. Total critical care time: 40 minutes. This time includes gathering of history, evaluation of patient's response to treatment, examination of patient, review of laboratory and imaging studies, and coordination with consultants. Greater than 50% of time spent in direct patient care.   For questions or updates, please contact [place] Please consult [email or web address] for contact info under     Signed, Buford Dresser, MD  05/25/2022, 10:17 AM

## 2022-05-25 NOTE — Progress Notes (Signed)
CARDIAC REHAB PHASE I   PRE:  Rate/Rhythm: 99 SR    BP: sitting 108/64    SaO2: 97 2L  MODE:  Ambulation: 270 ft   POST:  Rate/Rhythm: 115 ST standing, 102 walking    BP: sitting 121/52     SaO2: 100 2L  Pt stood from recliner and walked unit, slow and steady. C/o slight sensation in lower chest, she relates to her lungs. Sts it is not pain, not worse with movement or inspiration. To BR then bed. VSS  Discussed with pt and husband MI, stents, restrictions, Brilinta and smoking cessation. Pt receptive, thinking about quitting smoking. Gave resources. Will f/u Monday or Tuesday.  [place] BS, ACSM-CEP 05/25/2022 12:33 PM

## 2022-05-25 NOTE — Progress Notes (Signed)
  Echocardiogram 2D Echocardiogram has been performed.  Kristy Heath 05/25/2022, 8:50 AM

## 2022-05-25 NOTE — Progress Notes (Signed)
ANTICOAGULATION CONSULT NOTE - Initial Consult  Pharmacy Consult for Aggrastat Indication: chest pain/ACS  Allergies  Allergen Reactions   Shellfish Allergy     hives    Patient Measurements: Weight: 60 kg (132 lb 4.4 oz)  Vital Signs: Temp: 99.1 F (37.3 C) (01/06 0300) Temp Source: Oral (01/06 0300) BP: 110/66 (01/06 0645) Pulse Rate: 82 (01/06 0645)  Labs: Recent Labs    05/24/22 2015 05/25/22 0615  HGB 10.9* 9.7*  HCT 32.0* 29.7*  PLT  --  275  CREATININE 0.50 0.75    CrCl cannot be calculated (Unknown ideal weight.).  Assessment: Patient presented from [facility] with inferior STEMI, underwent emergent LHC 1/5 treated with DES to RCA. Noted to have distal low flow, so pharmacy consulted to monitor CBC on Aggrastat. Patient has hematoma requiring Aggrastat to be held overnight, resumed 05/25/22 AM. Cards planning staged PCI to LAD Monday.  Hematoma is large but stable per RN. RN to monitor hematoma for expansion. Hgb 9.7 and platelet count WNL. Will continue to monitor platelets.  Plan:  Okay to continue Aggrastat per cards, will trend platelets.  Thank you for allowing pharmacy to participate in this patient's care.  Reatha Harps, PharmD PGY2 Pharmacy Resident 05/25/2022 7:30 AM Check [email or web address] for unit specific pharmacy number

## 2022-05-26 DIAGNOSIS — E7800 Pure hypercholesterolemia, unspecified: Secondary | ICD-10-CM

## 2022-05-26 DIAGNOSIS — I2583 Coronary atherosclerosis due to lipid rich plaque: Secondary | ICD-10-CM

## 2022-05-26 LAB — POCT ACTIVATED CLOTTING TIME
Activated Clotting Time: 266 seconds
Activated Clotting Time: 261 seconds
Activated Clotting Time: 358 seconds

## 2022-05-26 LAB — BASIC METABOLIC PANEL
Anion gap: 8 (ref 5–15)
BUN: 8 mg/dL (ref 8–23)
CO2: 23 mmol/L (ref 22–32)
Calcium: 8.8 mg/dL — ABNORMAL LOW (ref 8.9–10.3)
Chloride: 105 mmol/L (ref 98–111)
Creatinine, Ser: 0.83 mg/dL (ref 0.44–1.00)
GFR, Estimated: >60 mL/min (ref >60)
Glucose, Bld: 118 mg/dL — ABNORMAL HIGH (ref 70–99)
Potassium: 3.6 mmol/L (ref 3.5–5.1)
Sodium: 136 mmol/L (ref 135–145)

## 2022-05-26 LAB — CBC
HCT: 29.6 % — ABNORMAL LOW (ref 36.0–46.0)
Hemoglobin: 10.2 g/dL — ABNORMAL LOW (ref 12.0–15.0)
MCH: 30.8 pg (ref 26.0–34.0)
MCHC: 34.5 g/dL (ref 30.0–36.0)
MCV: 89.4 fL (ref 80.0–100.0)
Platelets: 262 10*3/uL (ref 150–400)
RBC: 3.31 MIL/uL — ABNORMAL LOW (ref 3.87–5.11)
RDW: 13 % (ref 11.5–15.5)
WBC: 6.6 10*3/uL (ref 4.0–10.5)
nRBC: 0 % (ref 0.0–0.2)

## 2022-05-26 MED ORDER — SODIUM CHLORIDE 0.9 % WEIGHT BASED INFUSION
3.0000 mL/kg/h | INTRAVENOUS | Status: AC
  Administered 2022-05-27: 3 mL/kg/h via INTRAVENOUS

## 2022-05-26 MED ORDER — METOPROLOL TARTRATE 12.5 MG HALF TABLET
12.5000 mg | ORAL_TABLET | Freq: Two times a day (BID) | ORAL | Status: DC
  Administered 2022-05-26 (×2): 12.5 mg via ORAL
  Filled 2022-05-26 (×2): qty 1

## 2022-05-26 MED ORDER — POTASSIUM CHLORIDE CRYS ER 20 MEQ PO TBCR
40.0000 meq | EXTENDED_RELEASE_TABLET | Freq: Once | ORAL | Status: AC
  Administered 2022-05-26: 40 meq via ORAL
  Filled 2022-05-26: qty 2

## 2022-05-26 MED ORDER — SODIUM CHLORIDE 0.9% FLUSH: 3.0000 mL | INTRAVENOUS | Status: DC | PRN

## 2022-05-26 MED ORDER — ALPRAZOLAM 0.25 MG PO TABS
0.2500 mg | ORAL_TABLET | Freq: Once | ORAL | Status: AC
  Administered 2022-05-26: 0.25 mg via ORAL
  Filled 2022-05-26: qty 1

## 2022-05-26 MED ORDER — ASPIRIN 81 MG PO CHEW
81.0000 mg | CHEWABLE_TABLET | ORAL | Status: AC
  Administered 2022-05-27: 81 mg via ORAL
  Filled 2022-05-26: qty 1

## 2022-05-26 MED ORDER — SODIUM CHLORIDE 0.9 % IV SOLN: 250.0000 mL | INTRAVENOUS | Status: DC | PRN

## 2022-05-26 MED ORDER — SODIUM CHLORIDE 0.9 % WEIGHT BASED INFUSION
1.0000 mL/kg/h | INTRAVENOUS | Status: DC
  Administered 2022-05-27: 1 mL/kg/h via INTRAVENOUS

## 2022-05-26 NOTE — Progress Notes (Addendum)
Rounding Note    Patient Name: Kristy Heath Date of Encounter: 05/26/2022  [place] Cardiologist: Early Osmond, MD   Subjective   No acute events overnight. Right forearm hematoma resolving. Anxious about procedure tomorrow but ready to move forward. No chest pain.  Inpatient Medications    Scheduled Meds:  aspirin  81 mg Oral Daily   atorvastatin  80 mg Oral Daily   Chlorhexidine Gluconate Cloth  6 each Topical Daily   heparin injection (subcutaneous)  5,000 Units Subcutaneous Q8H   levothyroxine  50 mcg Oral QAC breakfast   metoprolol tartrate  12.5 mg Oral BID   pantoprazole  40 mg Oral Daily   sodium chloride flush  3 mL Intravenous Q12H   sodium chloride flush  3 mL Intravenous Q12H   ticagrelor  90 mg Oral BID   Continuous Infusions:  sodium chloride     norepinephrine (LEVOPHED) Adult infusion Stopped (05/25/22 0720)   PRN Meds: sodium chloride, acetaminophen, morphine injection, nitroGLYCERIN, ondansetron (ZOFRAN) IV, sodium chloride flush   Vital Signs    Vitals:   05/26/22 0700 05/26/22 0800 05/26/22 0830 05/26/22 0900  BP: 121/80 129/74  128/73  Pulse: (!) 105 (!) 101 (!) 122 (!) 110  Resp: (!) 23 19 19 19   Temp:   98.9 F (37.2 C)   TempSrc:   Oral   SpO2: 95% 97% 95% 97%  Weight:        Intake/Output Summary (Last 24 hours) at 05/26/2022 0937 Last data filed at 05/26/2022 0900 Gross per 24 hour  Intake 424.8 ml  Output 775 ml  Net -350.2 ml      05/24/2022    9:35 PM 06/02/2018    3:21 PM 12/27/2016   11:17 AM  Last 3 Weights  Weight (lbs) 132 lb 4.4 oz 132 lb 132 lb 3.2 oz  Weight (kg) 60 kg 59.875 kg 59.966 kg      Telemetry    NSR/sinus tachycardia - Personally Reviewed  ECG   05/26/22: sinus tachycardia, inferior T wave inversions - Personally Reviewed  05/25/22: NSR, inferior ST elevations resolved, inferior T wave inversions present - Personally Reviewed  Physical Exam   GEN: Well nourished, well developed in no  acute distress NECK: No JVD CARDIAC: regular rhythm, normal S1 and S2, no rubs or gallops. No murmur. VASCULAR: Radial pulses 2+ bilaterally. R forearm hematoma softer, with ecchymoses starting to appear up to axilla RESPIRATORY:  Clear to auscultation without rales, wheezing or rhonchi  ABDOMEN: Soft, non-tender, non-distended MUSCULOSKELETAL:  Moves all 4 limbs independently SKIN: Warm and dry, no edema NEUROLOGIC:  No focal neuro deficits noted. PSYCHIATRIC:  Normal affect    Labs    High Sensitivity Troponin:  No results for input(s): "TROPONINIHS" in the last 720 hours.   Chemistry Recent Labs  Lab 05/24/22 2015 05/25/22 0615 05/26/22 0803  NA 130* 138 136  K 3.2* 3.8 3.6  CL 98 109 105  CO2  --  20* 23  GLUCOSE 120* 113* 118*  BUN 13 10 8   CREATININE 0.50 0.75 0.83  CALCIUM  --  8.2* 8.8*  GFRNONAA  --  >60 >60  ANIONGAP  --  9 8    Lipids  Recent Labs  Lab 05/25/22 0615  CHOL 147  TRIG 307*  HDL 30*  LDLCALC 56  CHOLHDL 4.9    Hematology Recent Labs  Lab 05/24/22 2015 05/25/22 0615 05/26/22 0803  WBC  --  8.5 6.6  RBC  --  3.17* 3.31*  HGB 10.9* 9.7* 10.2*  HCT 32.0* 29.7* 29.6*  MCV  --  93.7 89.4  MCH  --  30.6 30.8  MCHC  --  32.7 34.5  RDW  --  12.9 13.0  PLT  --  275 262   Thyroid No results for input(s): "TSH", "FREET4" in the last 168 hours.  BNPNo results for input(s): "BNP", "PROBNP" in the last 168 hours.  DDimer No results for input(s): "DDIMER" in the last 168 hours.   Radiology    ECHOCARDIOGRAM COMPLETE  Result Date: 05/25/2022    ECHOCARDIOGRAM REPORT   Patient Name:   Kristy Heath Date of Exam: 05/25/2022 Medical Rec #:  [id number]     Height:       60.0 in Accession #:    [id number]    Weight:       132.3 lb Date of Birth:  [birth year]     BSA:          1.566 m Patient Age:    62 years      BP:           110/66 mmHg Patient Gender: F             HR:           85 bpm. Exam Location:  Inpatient Procedure: 2D Echo, Cardiac Doppler and  Color Doppler Indications:    Acute myocardial infarction  History:        Patient has no prior history of Echocardiogram examinations.                 Signs/Symptoms:Chest Pain; Risk Factors:Dyslipidemia and Current                 Smoker.  Sonographer:    Clayton Lefort RDCS (AE) Referring Phys: Renae Fickle  Sonographer Comments: Suboptimal parasternal window. IMPRESSIONS  1. Poor acoustic windows  2. Left ventricular ejection fraction, by estimation, is 55 to 60%. The left ventricle has normal function. Left ventricular endocardial border not optimally defined to evaluate regional wall motion. Left ventricular diastolic parameters are consistent with Grade I diastolic dysfunction (impaired relaxation).  3. Right ventricular systolic function was not well visualized. The right ventricular size is not well visualized.  4. The mitral valve is abnormal. No evidence of mitral valve regurgitation. No evidence of mitral stenosis.  5. The aortic valve was not well visualized. There is moderate calcification of the aortic valve. Aortic valve regurgitation is not visualized. No aortic stenosis is present.  6. The inferior vena cava is normal in size with greater than 50% respiratory variability, suggesting right atrial pressure of 3 mmHg. Comparison(s): No prior Echocardiogram. FINDINGS  Left Ventricle: Left ventricular ejection fraction, by estimation, is 55 to 60%. The left ventricle has normal function. Left ventricular endocardial border not optimally defined to evaluate regional wall motion. The left ventricular internal cavity size was normal in size. There is no left ventricular hypertrophy. Left ventricular diastolic parameters are consistent with Grade I diastolic dysfunction (impaired relaxation). Right Ventricle: The right ventricular size is not well visualized. Right vetricular wall thickness was not well visualized. Right ventricular systolic function was not well visualized. Left Atrium: Left atrial size was  normal in size. Right Atrium: Right atrial size was normal in size. Pericardium: Trivial pericardial effusion is present. Mitral Valve: The mitral valve is abnormal. There is mild calcification of the mitral valve leaflet(s). Mild to moderate mitral annular calcification. No evidence of mitral valve regurgitation. No  evidence of mitral valve stenosis. Tricuspid Valve: The tricuspid valve is not well visualized. Tricuspid valve regurgitation is not demonstrated. No evidence of tricuspid stenosis. Aortic Valve: The aortic valve was not well visualized. There is moderate calcification of the aortic valve. Aortic valve regurgitation is not visualized. No aortic stenosis is present. Aortic valve mean gradient measures 6.0 mmHg. Aortic valve peak gradient measures 11.3 mmHg. Aortic valve area, by VTI measures 1.38 cm. Pulmonic Valve: The pulmonic valve was not well visualized. Pulmonic valve regurgitation is not visualized. No evidence of pulmonic stenosis. Aorta: The aortic root is normal in size and structure. Venous: The inferior vena cava is normal in size with greater than 50% respiratory variability, suggesting right atrial pressure of 3 mmHg. IAS/Shunts: The interatrial septum was not well visualized.  LEFT VENTRICLE PLAX 2D LVIDd:         4.50 cm   Diastology LVIDs:         3.20 cm   LV e' medial:    7.83 cm/s LV PW:         0.80 cm   LV E/e' medial:  9.8 LV IVS:        1.00 cm   LV e' lateral:   10.30 cm/s LVOT diam:     1.80 cm   LV E/e' lateral: 7.5 LV SV:         41 LV SV Index:   26 LVOT Area:     2.54 cm  RIGHT VENTRICLE            IVC RV Basal diam:  3.30 cm    IVC diam: 1.60 cm RV S prime:     9.03 cm/s LEFT ATRIUM           Index        RIGHT ATRIUM           Index LA diam:      2.70 cm 1.72 cm/m   RA Area:     12.00 cm LA Vol (A2C): 22.4 ml 14.31 ml/m  RA Volume:   26.90 ml  17.18 ml/m LA Vol (A4C): 16.9 ml 10.79 ml/m  AORTIC VALVE AV Area (Vmax):    1.32 cm AV Area (Vmean):   1.38 cm AV Area  (VTI):     1.38 cm AV Vmax:           168.00 cm/s AV Vmean:          111.000 cm/s AV VTI:            0.296 m AV Peak Grad:      11.3 mmHg AV Mean Grad:      6.0 mmHg LVOT Vmax:         87.40 cm/s LVOT Vmean:        60.400 cm/s LVOT VTI:          0.161 m LVOT/AV VTI ratio: 0.54  AORTA Ao Root diam: 3.20 cm MITRAL VALVE MV Area (PHT): 3.42 cm    SHUNTS MV Decel Time: 222 msec    Systemic VTI:  0.16 m MV E velocity: 77.10 cm/s  Systemic Diam: 1.80 cm MV A velocity: 78.10 cm/s MV E/A ratio:  0.99 Vishnu Priya Mallipeddi Electronically signed by Winfield RastVishnu Priya Mallipeddi Signature Date/Time: 05/25/2022/10:52:38 AM    Final    CARDIAC CATHETERIZATION  Addendum Date: 05/25/2022     Prox RCA lesion is 100% stenosed.   Mid LAD lesion is 85% stenosed.   A stent was successfully placed.  Post intervention, there is a 0% residual stenosis. 1.  100% occluded proximal right coronary artery treated with 2 overlapping drug-eluting stents; due to distal embolization to the distal aspect of the RPLV system the patient will be treated with Aggrastat for 18 hours along with pain control. 2.  Residual high-grade mid LAD lesion will be staged in the coming days. 3.  LVEDP of 12 mmHg. Recommendation: Aspirin, Brilinta, and hold beta-blockade until norepinephrine weaned off.  Continue Aggrastat for 18 hours.  Staged PCI of mid LAD on Monday.  Addendum Date: 05/25/2022     Prox RCA lesion is 100% stenosed.   Mid LAD lesion is 85% stenosed.   A stent was successfully placed.   Post intervention, there is a 0% residual stenosis. 1.  100% occluded proximal right coronary artery treated with 2 overlapping drug-eluting stents; due to distal embolization to the distal aspect of the RPLV system the patient will be treated with Aggrastat for 18 hours along with pain control. 2.  Residual high-grade mid LAD lesion will be staged in the coming days. 3.  LVEDP of 12 mmHg. Recommendation: Aspirin, Brilinta, and hold beta-blockade for at least 24  hours.  Continue Aggrastat for 18 hours.  Staged PCI of mid LAD on Monday.  Result Date: 05/25/2022   Prox RCA lesion is 100% stenosed.   Mid LAD lesion is 85% stenosed.   A stent was successfully placed.   Post intervention, there is a 0% residual stenosis. 1.  100% occluded proximal right coronary artery treated with 2 overlapping drug-eluting stents; due to distal embolization to the distal aspect of the RPLV system the patient will be treated with Aggrastat for 12 hours along with pain control. 2.  Residual high-grade mid LAD lesion will be staged in the coming days. 3.  LVEDP of 12 mmHg. Recommendation: Aspirin, Brilinta, and hold beta-blockade for at least 24 hours.  Continue Aggrastat for 18 hours.  Staged PCI of mid LAD on Monday.    Cardiac Studies   Echo 05/25/22  1. Poor acoustic windows   2. Left ventricular ejection fraction, by estimation, is 55 to 60%. The left ventricle has normal function. Left ventricular endocardial border not optimally defined to evaluate regional wall motion. Left ventricular  diastolic parameters are consistent with Grade I diastolic dysfunction (impaired relaxation).   3. Right ventricular systolic function was not well visualized. The right ventricular size is not well visualized.   4. The mitral valve is abnormal. No evidence of mitral valve regurgitation. No evidence of mitral stenosis.   5. The aortic valve was not well visualized. There is moderate calcification of the aortic valve. Aortic valve regurgitation is not visualized. No aortic stenosis is present.   6. The inferior vena cava is normal in size with greater than 50% respiratory variability, suggesting right atrial pressure of 3 mmHg.    Cath 05/24/22   Prox RCA lesion is 100% stenosed.   Mid LAD lesion is 85% stenosed.   A stent was successfully placed.   Post intervention, there is a 0% residual stenosis.   1.  100% occluded proximal right coronary artery treated with 2 overlapping drug-eluting  stents; due to distal embolization to the distal aspect of the RPLV system the patient will be treated with Aggrastat for 18 hours along with pain control. 2.  Residual high-grade mid LAD lesion will be staged in the coming days. 3.  LVEDP of 12 mmHg.   Recommendation: Aspirin, Brilinta, and hold beta-blockade until norepinephrine  weaned off.  Continue Aggrastat for 18 hours.  Staged PCI of mid LAD on Monday.  Patient Profile     62 y.o. female with PMH carotid stenosis, hyperlipidemia, current tobacco use, family history of CAD who was brought to cath lab 05/24/22 for inferior STEMI from [facility]. Course complicated by radial hematoma at access site while on aggrastat.  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 Was on red yeast rice prior to admission, started on atorvastatin this admission  Hypotension requiring IV pressors -off norepinephrine  Radial hematoma after procedure -was on aggrastat at the time.  -Hgb 10.9 -> 9.7 -> 10.2 -continue to monitor  Nurse reports that she did not sleep well, having some anxiety. OK to give a PRN this evening for sleep if needed.  Orders to transfer to the floor placed.  For questions or updates, please contact [place] Please consult [email or web address] for contact info under     Signed, Buford Dresser, MD  05/26/2022, 9:37 AM

## 2022-05-27 ENCOUNTER — Other Ambulatory Visit (HOSPITAL_COMMUNITY): Payer: Self-pay

## 2022-05-27 ENCOUNTER — Encounter (HOSPITAL_COMMUNITY)
Admission: AC | Disposition: A | Discharge status: Home / Self Care | Payer: Self-pay | Source: Other Acute Inpatient Hospital | Attending: Internal Medicine

## 2022-05-27 ENCOUNTER — Encounter (HOSPITAL_COMMUNITY): Payer: Self-pay | Admitting: Internal Medicine

## 2022-05-27 DIAGNOSIS — I959 Hypotension, unspecified: Secondary | ICD-10-CM | POA: Diagnosis not present

## 2022-05-27 DIAGNOSIS — I25110 Atherosclerotic heart disease of native coronary artery with unstable angina pectoris: Secondary | ICD-10-CM | POA: Diagnosis not present

## 2022-05-27 DIAGNOSIS — R69 Illness, unspecified: Secondary | ICD-10-CM | POA: Diagnosis not present

## 2022-05-27 DIAGNOSIS — E785 Hyperlipidemia, unspecified: Secondary | ICD-10-CM | POA: Insufficient documentation

## 2022-05-27 DIAGNOSIS — R230 Cyanosis: Secondary | ICD-10-CM | POA: Diagnosis not present

## 2022-05-27 DIAGNOSIS — I069 Rheumatic aortic valve disease, unspecified: Secondary | ICD-10-CM | POA: Diagnosis not present

## 2022-05-27 DIAGNOSIS — Z720 Tobacco use: Secondary | ICD-10-CM | POA: Insufficient documentation

## 2022-05-27 DIAGNOSIS — I708 Atherosclerosis of other arteries: Secondary | ICD-10-CM | POA: Diagnosis not present

## 2022-05-27 DIAGNOSIS — I6523 Occlusion and stenosis of bilateral carotid arteries: Secondary | ICD-10-CM | POA: Diagnosis not present

## 2022-05-27 DIAGNOSIS — I2111 ST elevation (STEMI) myocardial infarction involving right coronary artery: Secondary | ICD-10-CM | POA: Diagnosis not present

## 2022-05-27 DIAGNOSIS — Z79899 Other long term (current) drug therapy: Secondary | ICD-10-CM | POA: Diagnosis not present

## 2022-05-27 DIAGNOSIS — I2119 ST elevation (STEMI) myocardial infarction involving other coronary artery of inferior wall: Secondary | ICD-10-CM | POA: Diagnosis not present

## 2022-05-27 DIAGNOSIS — Z8249 Family history of ischemic heart disease and other diseases of the circulatory system: Secondary | ICD-10-CM | POA: Diagnosis not present

## 2022-05-27 DIAGNOSIS — I2510 Atherosclerotic heart disease of native coronary artery without angina pectoris: Secondary | ICD-10-CM

## 2022-05-27 DIAGNOSIS — S5011XA Contusion of right forearm, initial encounter: Secondary | ICD-10-CM | POA: Diagnosis not present

## 2022-05-27 HISTORY — PX: INTRAVASCULAR ULTRASOUND/IVUS: CATH118244

## 2022-05-27 HISTORY — PX: CORONARY STENT INTERVENTION: CATH118234

## 2022-05-27 HISTORY — PX: LEFT HEART CATH AND CORONARY ANGIOGRAPHY: CATH118249

## 2022-05-27 LAB — BASIC METABOLIC PANEL
Anion gap: 6 (ref 5–15)
BUN: 8 mg/dL (ref 8–23)
CO2: 24 mmol/L (ref 22–32)
Calcium: 8.8 mg/dL — ABNORMAL LOW (ref 8.9–10.3)
Chloride: 104 mmol/L (ref 98–111)
Creatinine, Ser: 0.8 mg/dL (ref 0.44–1.00)
GFR, Estimated: >60 mL/min (ref >60)
Glucose, Bld: 108 mg/dL — ABNORMAL HIGH (ref 70–99)
Potassium: 3.7 mmol/L (ref 3.5–5.1)
Sodium: 134 mmol/L — ABNORMAL LOW (ref 135–145)

## 2022-05-27 LAB — CBC
HCT: 31.6 % — ABNORMAL LOW (ref 36.0–46.0)
Hemoglobin: 10.4 g/dL — ABNORMAL LOW (ref 12.0–15.0)
MCH: 30 pg (ref 26.0–34.0)
MCHC: 32.9 g/dL (ref 30.0–36.0)
MCV: 91.1 fL (ref 80.0–100.0)
Platelets: 278 10*3/uL (ref 150–400)
RBC: 3.47 MIL/uL — ABNORMAL LOW (ref 3.87–5.11)
RDW: 12.8 % (ref 11.5–15.5)
WBC: 6 10*3/uL (ref 4.0–10.5)
nRBC: 0 % (ref 0.0–0.2)

## 2022-05-27 LAB — POCT ACTIVATED CLOTTING TIME
Activated Clotting Time: 244 seconds
Activated Clotting Time: 277 seconds
Activated Clotting Time: 201 seconds
Activated Clotting Time: 174 seconds

## 2022-05-27 LAB — HEMOGLOBIN A1C
Hgb A1c MFr Bld: 5.9 % — ABNORMAL HIGH (ref 4.8–5.6)
Mean Plasma Glucose: 123 mg/dL

## 2022-05-27 SURGERY — CORONARY STENT INTERVENTION
Anesthesia: LOCAL

## 2022-05-27 MED ORDER — HEPARIN (PORCINE) IN NACL 1000-0.9 UT/500ML-% IV SOLN
INTRAVENOUS | Status: AC
  Filled 2022-05-27: qty 500

## 2022-05-27 MED ORDER — LABETALOL HCL 5 MG/ML IV SOLN: 10.0000 mg | INTRAVENOUS | Status: AC | PRN

## 2022-05-27 MED ORDER — PANTOPRAZOLE SODIUM 40 MG PO TBEC
40.0000 mg | DELAYED_RELEASE_TABLET | Freq: Every day | ORAL | 0 refills | Status: DC
  Filled 2022-05-27: qty 30, 30d supply, fill #0

## 2022-05-27 MED ORDER — FENTANYL CITRATE (PF) 100 MCG/2ML IJ SOLN
INTRAMUSCULAR | Status: AC
  Filled 2022-05-27: qty 2

## 2022-05-27 MED ORDER — NITROGLYCERIN 1 MG/10 ML FOR IR/CATH LAB
INTRA_ARTERIAL | Status: AC
  Filled 2022-05-27: qty 10

## 2022-05-27 MED ORDER — ONDANSETRON HCL 4 MG/2ML IJ SOLN: 4.0000 mg | Freq: Four times a day (QID) | INTRAMUSCULAR | Status: DC | PRN

## 2022-05-27 MED ORDER — HEPARIN SODIUM (PORCINE) 1000 UNIT/ML IJ SOLN
INTRAMUSCULAR | Status: DC | PRN
  Administered 2022-05-27: 7000 [IU] via INTRAVENOUS

## 2022-05-27 MED ORDER — MIDAZOLAM HCL 2 MG/2ML IJ SOLN
INTRAMUSCULAR | Status: AC
  Filled 2022-05-27: qty 2

## 2022-05-27 MED ORDER — MIDAZOLAM HCL 2 MG/2ML IJ SOLN
INTRAMUSCULAR | Status: DC | PRN
  Administered 2022-05-27 (×2): 1 mg via INTRAVENOUS

## 2022-05-27 MED ORDER — LIDOCAINE HCL (PF) 1 % IJ SOLN
INTRAMUSCULAR | Status: DC | PRN
  Administered 2022-05-27: 10 mL

## 2022-05-27 MED ORDER — ASPIRIN 81 MG PO CHEW
81.0000 mg | CHEWABLE_TABLET | Freq: Every day | ORAL | 2 refills | Status: AC
  Filled 2022-05-27: qty 90, 90d supply, fill #0

## 2022-05-27 MED ORDER — SODIUM CHLORIDE 0.9% FLUSH: 3.0000 mL | INTRAVENOUS | Status: DC | PRN

## 2022-05-27 MED ORDER — METOPROLOL TARTRATE 25 MG PO TABS
12.5000 mg | ORAL_TABLET | Freq: Two times a day (BID) | ORAL | 1 refills | Status: DC
  Filled 2022-05-27: qty 30, 30d supply, fill #0

## 2022-05-27 MED ORDER — TICAGRELOR 90 MG PO TABS
90.0000 mg | ORAL_TABLET | Freq: Two times a day (BID) | ORAL | 1 refills | Status: DC
  Filled 2022-05-27: qty 60, 30d supply, fill #0

## 2022-05-27 MED ORDER — ATORVASTATIN CALCIUM 80 MG PO TABS
80.0000 mg | ORAL_TABLET | Freq: Every day | ORAL | 1 refills | Status: DC
  Filled 2022-05-27: qty 30, 30d supply, fill #0

## 2022-05-27 MED ORDER — SODIUM CHLORIDE 0.9 % IV SOLN: INTRAVENOUS | Status: AC

## 2022-05-27 MED ORDER — ACETAMINOPHEN 325 MG PO TABS: 650.0000 mg | ORAL_TABLET | ORAL | Status: DC | PRN

## 2022-05-27 MED ORDER — HYDRALAZINE HCL 20 MG/ML IJ SOLN: 10.0000 mg | INTRAMUSCULAR | Status: AC | PRN

## 2022-05-27 MED ORDER — HEPARIN (PORCINE) IN NACL 1000-0.9 UT/500ML-% IV SOLN
INTRAVENOUS | Status: DC | PRN
  Administered 2022-05-27 (×2): 500 mL

## 2022-05-27 MED ORDER — LIDOCAINE HCL (PF) 1 % IJ SOLN
INTRAMUSCULAR | Status: AC
  Filled 2022-05-27: qty 30

## 2022-05-27 MED ORDER — FENTANYL CITRATE (PF) 100 MCG/2ML IJ SOLN
INTRAMUSCULAR | Status: DC | PRN
  Administered 2022-05-27 (×4): 25 ug via INTRAVENOUS

## 2022-05-27 MED ORDER — SODIUM CHLORIDE 0.9 % IV SOLN: 250.0000 mL | INTRAVENOUS | Status: DC | PRN

## 2022-05-27 MED ORDER — IOHEXOL 350 MG/ML SOLN
INTRAVENOUS | Status: DC | PRN
  Administered 2022-05-27: 60 mL via INTRA_ARTERIAL

## 2022-05-27 MED ORDER — SODIUM CHLORIDE 0.9% FLUSH: 3.0000 mL | Freq: Two times a day (BID) | INTRAVENOUS | Status: DC

## 2022-05-27 MED ORDER — NITROGLYCERIN 0.4 MG SL SUBL
0.4000 mg | SUBLINGUAL_TABLET | SUBLINGUAL | 2 refills | Status: AC | PRN
  Filled 2022-05-27: qty 25, 7d supply, fill #0

## 2022-05-27 MED ORDER — TICAGRELOR 90 MG PO TABS: 90.0000 mg | ORAL_TABLET | Freq: Two times a day (BID) | ORAL | Status: DC

## 2022-05-27 MED ORDER — HEPARIN (PORCINE) IN NACL 1000-0.9 UT/500ML-% IV SOLN
INTRAVENOUS | Status: AC
  Filled 2022-05-27: qty 1000

## 2022-05-27 MED ORDER — ASPIRIN 81 MG PO CHEW: 81.0000 mg | CHEWABLE_TABLET | Freq: Every day | ORAL | Status: DC

## 2022-05-27 SURGICAL SUPPLY — 26 items
BALL SAPPHIRE NC24 2.75X15 (BALLOONS) ×1
BALLN EMERGE MR 2.0X15 (BALLOONS) ×1
BALLOON EMERGE MR 2.0X15 (BALLOONS) IMPLANT
BALLOON SAPPHIRE NC24 2.75X15 (BALLOONS) IMPLANT
CATH INFINITI 5FR MULTPACK ANG (CATHETERS) IMPLANT
CATH LAUNCHER 6FR EBU 3.5 SH (CATHETERS) IMPLANT
CATH OPTICROSS HD (CATHETERS) IMPLANT
ELECT DEFIB PAD ADLT CADENCE (PAD) IMPLANT
GUIDEWIRE INQWIRE 1.5J.035X260 (WIRE) IMPLANT
INQWIRE 1.5J .035X260CM (WIRE) ×1
KIT ENCORE 26 ADVANTAGE (KITS) IMPLANT
KIT HEART LEFT (KITS) ×1 IMPLANT
KIT MICROPUNCTURE NIT STIFF (SHEATH) IMPLANT
PACK CARDIAC CATHETERIZATION (CUSTOM PROCEDURE TRAY) ×1 IMPLANT
PROTECTION STATION PRESSURIZED (MISCELLANEOUS) ×1
SHEATH PINNACLE 6F 10CM (SHEATH) IMPLANT
SHEATH PROBE COVER 6X72 (BAG) IMPLANT
SLED PULL BACK IVUS (MISCELLANEOUS) IMPLANT
STATION PROTECTION PRESSURIZED (MISCELLANEOUS) IMPLANT
STENT SYNERGY XD 2.50X28 (Permanent Stent) IMPLANT
SYNERGY XD 2.50X28 (Permanent Stent) ×1 IMPLANT
TRANSDUCER W/STOPCOCK (MISCELLANEOUS) ×1 IMPLANT
TUBING CIL FLEX 10 FLL-RA (TUBING) ×1 IMPLANT
VALVE GUARDIAN II ~~LOC~~ HEMO (MISCELLANEOUS) IMPLANT
WIRE ASAHI PROWATER 180CM (WIRE) IMPLANT
WIRE EMERALD 3MM-J .035X150CM (WIRE) IMPLANT

## 2022-05-27 NOTE — Progress Notes (Signed)
Rounding Note    Patient Name: Kristy Heath Date of Encounter: 05/27/2022  [place] Cardiologist: Early Osmond, MD   Subjective   Doing well. Husband at bedside. No CP or dyspnea. Eager to have next procedure done. On for staged PCI of the LAD today.   Inpatient Medications    Scheduled Meds:  aspirin  81 mg Oral Daily   atorvastatin  80 mg Oral Daily   Chlorhexidine Gluconate Cloth  6 each Topical Daily   heparin injection (subcutaneous)  5,000 Units Subcutaneous Q8H   levothyroxine  50 mcg Oral QAC breakfast   metoprolol tartrate  12.5 mg Oral BID   pantoprazole  40 mg Oral Daily   sodium chloride flush  3 mL Intravenous Q12H   sodium chloride flush  3 mL Intravenous Q12H   ticagrelor  90 mg Oral BID   Continuous Infusions:  sodium chloride     sodium chloride     sodium chloride 1 mL/kg/hr (05/27/22 0800)   norepinephrine (LEVOPHED) Adult infusion Stopped (05/25/22 0720)   PRN Meds: sodium chloride, sodium chloride, acetaminophen, morphine injection, nitroGLYCERIN, ondansetron (ZOFRAN) IV, sodium chloride flush, sodium chloride flush   Vital Signs    Vitals:   05/26/22 1933 05/26/22 2353 05/27/22 0429 05/27/22 0720  BP: 118/65 90/70 95/64  100/67  Pulse: (!) 107 88 92 87  Resp: 20 16 15 16   Temp: 98.5 F (36.9 C) 98.8 F (37.1 C) 98.1 F (36.7 C) 98.1 F (36.7 C)  TempSrc: Oral Oral Oral Oral  SpO2: 98% 97% 98% 98%  Weight:      Height:    4\' 11"  (1.499 m)    Intake/Output Summary (Last 24 hours) at 05/27/2022 0855 Last data filed at 05/27/2022 0646 Gross per 24 hour  Intake 436.8 ml  Output 375 ml  Net 61.8 ml      05/24/2022    9:35 PM 06/02/2018    3:21 PM 12/27/2016   11:17 AM  Last 3 Weights  Weight (lbs) 132 lb 4.4 oz 132 lb 132 lb 3.2 oz  Weight (kg) 60 kg 59.875 kg 59.966 kg      Telemetry    Sinus rhythm/sinus tach - Personally Reviewed   Physical Exam  Alert, oriented, NAD GEN: No acute distress.   Neck: No  JVD Cardiac: RRR, no murmurs, rubs, or gallops.  Respiratory: Clear to auscultation bilaterally. GI: Soft, nontender, non-distended  MS: No lower extremity edema; No deformity. Right arm edema/ecchymoses noted throughout Neuro:  Nonfocal  Psych: Normal affect   Labs    High Sensitivity Troponin:  No results for input(s): "TROPONINIHS" in the last 720 hours.   Chemistry Recent Labs  Lab 05/25/22 0615 05/26/22 0803 05/27/22 0509  NA 138 136 134*  K 3.8 3.6 3.7  CL 109 105 104  CO2 20* 23 24  GLUCOSE 113* 118* 108*  BUN 10 8 8   CREATININE 0.75 0.83 0.80  CALCIUM 8.2* 8.8* 8.8*  GFRNONAA >60 >60 >60  ANIONGAP 9 8 6     Lipids  Recent Labs  Lab 05/25/22 0615  CHOL 147  TRIG 307*  HDL 30*  LDLCALC 56  CHOLHDL 4.9    Hematology Recent Labs  Lab 05/25/22 0615 05/26/22 0803 05/27/22 0509  WBC 8.5 6.6 6.0  RBC 3.17* 3.31* 3.47*  HGB 9.7* 10.2* 10.4*  HCT 29.7* 29.6* 31.6*  MCV 93.7 89.4 91.1  MCH 30.6 30.8 30.0  MCHC 32.7 34.5 32.9  RDW 12.9 13.0 12.8  PLT  275 262 278   Thyroid No results for input(s): "TSH", "FREET4" in the last 168 hours.  BNPNo results for input(s): "BNP", "PROBNP" in the last 168 hours.  DDimer No results for input(s): "DDIMER" in the last 168 hours.   Radiology    No results found.   Patient Profile     62 y.o. female with PMH carotid stenosis, hyperlipidemia, current tobacco use, family history of CAD who was brought to cath lab 05/24/22 for inferior STEMI from [facility]. Course complicated by radial hematoma at access site while on aggrastat.   Assessment & Plan    Inferior STEMI: presented 1/5 and underwent primary PCI of the RCA. On ASA/ticagrelor/high intensity statin. LVEF preserved. For staged PCI of the proximal LAD today. Reviewed films and discussed case with Dr Eldridge Dace who is doing her procedure. Tolerating low-dose metoprolol but BP will not allow for further med titration such as ACE/ARB Tobacco: extensive cessation  counseling done. She has used Chantix in the past. Not interested in nicotine replacement therapy. Will quit 'cold [place].' Hyperlipidemia: started on high intensity statin with atorva 80 mg 4. Arm hematoma: related to 2B3a use for coronary thrombus/embolization. Overall appears stable. HgB 10.4 - stable.   Dispo: possibly home today pending PCI result.       For questions or updates, please contact [facility] Please consult [email or web address] for contact info under        Signed, Tonny Bollman, MD  05/27/2022, 8:55 AM

## 2022-05-27 NOTE — Progress Notes (Signed)
CARDIAC REHAB PHASE I   Post MI/stent education including smoking cessation, risk factors, restrictions, heart healthy diet, exercise guidelines, site care, antiplatelet therapy importance and CRP2 reviewed. All questions and concerns addressed. Will refer to AP for CRP2. Plan for home today.   [id number]  Vanessa Barbara, RN BSN 05/27/2022 3:15 PM

## 2022-05-27 NOTE — Discharge Instructions (Signed)

## 2022-05-27 NOTE — Progress Notes (Signed)
SITE AREA: right groin/femoral  SITE PRIOR TO REMOVAL:  LEVEL 0  PRESSURE APPLIED FOR: approximately 20 minutes  MANUAL: yes  PATIENT STATUS DURING PULL: stable  POST PULL SITE:  LEVEL 0  POST PULL INSTRUCTIONS GIVEN: yes  POST PULL PULSES PRESENT: bilateral pedal pulses palpable at +2  DRESSING APPLIED: gauze with tegaderm  BEDREST BEGINS @ 1424  COMMENTS: purewick placed prior to sheath removal, peri care given, tolerated well, + void noted

## 2022-05-27 NOTE — Progress Notes (Signed)
Pt ambulated to and from bathroom with no bleed from groin site

## 2022-05-27 NOTE — Interval H&P Note (Signed)
Cath Lab Visit (complete for each Cath Lab visit)  Clinical Evaluation Leading to the Procedure:   ACS: Yes.    Non-ACS:    Anginal Classification: CCS IV  Anti-ischemic medical therapy: Minimal Therapy (1 class of medications)  Non-Invasive Test Results: No non-invasive testing performed  Prior CABG: No previous CABG      History and Physical Interval Note:  05/27/2022 9:14 AM  Kristy Heath  has presented today for surgery, with the diagnosis of CAD.  The various methods of treatment have been discussed with the patient and family. After consideration of risks, benefits and other options for treatment, the patient has consented to  Procedure(s): CORONARY STENT INTERVENTION (N/A) as a surgical intervention.  The patient's history has been reviewed, patient examined, no change in status, stable for surgery.  I have reviewed the patient's chart and labs.  Questions were answered to the patient's satisfaction.     Larae Grooms

## 2022-05-27 NOTE — Discharge Summary (Signed)
Discharge Summary    Patient ID: Kristy Heath MRN: [id number]; DOB: 12/14/1960  Admit date: 05/24/2022 Discharge date: 05/27/2022  PCP:  Estanislado Pandy, MD    [facility] Providers Cardiologist:  Orbie Pyo, MD    Discharge Diagnoses    Principal Problem:   STEMI (ST elevation myocardial infarction) [facility]) Active Problems:   Coronary artery disease involving native coronary artery of native heart with unstable angina pectoris (HCC)   Hyperlipidemia   Tobacco use  Diagnostic Studies/Procedures    Cath: 05/24/2022    Prox RCA lesion is 100% stenosed.   Mid LAD lesion is 85% stenosed.   A stent was successfully placed.   Post intervention, there is a 0% residual stenosis.   1.  100% occluded proximal right coronary artery treated with 2 overlapping drug-eluting stents; due to distal embolization to the distal aspect of the RPLV system the patient will be treated with Aggrastat for 18 hours along with pain control. 2.  Residual high-grade mid LAD lesion will be staged in the coming days. 3.  LVEDP of 12 mmHg.   Recommendation: Aspirin, Brilinta, and hold beta-blockade until norepinephrine weaned off.  Continue Aggrastat for 18 hours.  Staged PCI of mid LAD on Monday.  Diagnostic Dominance: Right  Intervention    Echo: 05/25/2022  IMPRESSIONS     1. Poor acoustic windows   2. Left ventricular ejection fraction, by estimation, is 55 to 60%. The  left ventricle has normal function. Left ventricular endocardial border  not optimally defined to evaluate regional wall motion. Left ventricular  diastolic parameters are consistent  with Grade I diastolic dysfunction (impaired relaxation).   3. Right ventricular systolic function was not well visualized. The right  ventricular size is not well visualized.   4. The mitral valve is abnormal. No evidence of mitral valve  regurgitation. No evidence of mitral stenosis.   5. The aortic valve was not well visualized.  There is moderate  calcification of the aortic valve. Aortic valve regurgitation is not  visualized. No aortic stenosis is present.   6. The inferior vena cava is normal in size with greater than 50%  respiratory variability, suggesting right atrial pressure of 3 mmHg.   Comparison(s): No prior Echocardiogram.   FINDINGS   Left Ventricle: Left ventricular ejection fraction, by estimation, is 55  to 60%. The left ventricle has normal function. Left ventricular  endocardial border not optimally defined to evaluate regional wall motion.  The left ventricular internal cavity  size was normal in size. There is no left ventricular hypertrophy. Left  ventricular diastolic parameters are consistent with Grade I diastolic  dysfunction (impaired relaxation).   Right Ventricle: The right ventricular size is not well visualized. Right  vetricular wall thickness was not well visualized. Right ventricular  systolic function was not well visualized.   Left Atrium: Left atrial size was normal in size.   Right Atrium: Right atrial size was normal in size.   Pericardium: Trivial pericardial effusion is present.   Mitral Valve: The mitral valve is abnormal. There is mild calcification of  the mitral valve leaflet(s). Mild to moderate mitral annular  calcification. No evidence of mitral valve regurgitation. No evidence of  mitral valve stenosis.   Tricuspid Valve: The tricuspid valve is not well visualized. Tricuspid  valve regurgitation is not demonstrated. No evidence of tricuspid  stenosis.   Aortic Valve: The aortic valve was not well visualized. There is moderate  calcification of the aortic valve. Aortic  valve regurgitation is not  visualized. No aortic stenosis is present. Aortic valve mean gradient  measures 6.0 mmHg. Aortic valve peak  gradient measures 11.3 mmHg. Aortic valve area, by VTI measures 1.38 cm.   Pulmonic Valve: The pulmonic valve was not well visualized. Pulmonic valve   regurgitation is not visualized. No evidence of pulmonic stenosis.   Aorta: The aortic root is normal in size and structure.   Venous: The inferior vena cava is normal in size with greater than 50%  respiratory variability, suggesting right atrial pressure of 3 mmHg.   IAS/Shunts: The interatrial septum was not well visualized.       Cath: 05/27/2022     Mid LAD lesion is 85% stenosed.  A drug-eluting stent was successfully placed using a SYNERGY XD 2.50X28, postdilated to 2.75 mm and optimized with intravascular ultrasound.   Post intervention, there is a 0% residual stenosis.   1st Diag lesion is 50% stenosed.   Non-stenotic Prox RCA lesion was previously treated.   LV end diastolic pressure is normal.   There is no aortic valve stenosis.   Continue dual antiplatelet therapy for at least 1 year.  Continue aggressive secondary prevention.    Hopeful for discharge later today after bedrest assuming no groin issues.   Diagnostic Dominance: Right  Intervention    _____________   History of Present Illness     Kristy Heath is a 62 y.o. female with PMH Carotid stenosis ( RICA <50% stenosis, LICA 50% stenosis, and partial flow reversal in L [place]), ?left subclavian stenosis, HLD  who was seen 05/24/2022 for the evaluation of chest pain- Inferior STEMI.   She presented to [facility] for chest pain, sweating, dizziness and not feeling well. Earlier today she did elipitical for 6-65mins and then started having weird feeling in the chest- which she cannot describe, then noted to have nausea, chest pain, sweating- didn't feel right and became dizzy as well- did not pass out. Given her symptoms- they went to [facility] where EKG showed inferior STEMI- activated and brought directly to the CATH lab   Patient was on Norepi when she came in. At OSH received aspirin, morphine, nitro and heparin gtt. Taken to the cath lab urgently.    Current tobacco use:1/2 ppd FH: significant for  premature CAD- father with MI, and multiple family members  Hospital Course     Inferior STEMI -- Underwent cardiac catheterization 1/5 with 100% occluded proximal RCA treated with PCI/DES x 2 with distal embolization of the R PLV and placed on Aggrastat for 18 hours.  Did have residual high-grade mid LAD lesion with staged intervention on 1/8 with DES x 1.  Recommendations for DAPT with aspirin/Brilinta for at least 1 year.  Follow-up echocardiogram showed LVEF of 55 to 60%, grade 1 diastolic dysfunction, no significant valvular disease.  She was evaluated by cardiac rehab. Given her insurance, she will need to switch to plavix or Effient after her initial 30 days of Brilinta.  -- Continue aspirin, Brilinta, low-dose metoprolol and statin  Tobacco use -- Cessation advised  Hyperlipidemia -- LDL 56, HDL 30 -- Placed on atorvastatin 80 mg daily -- Will need LFTs/FLP in 8 weeks  Arm Hematoma -- developed after initial cath 1/5 after receiving aggrastat. Remained stable  Patient was seen by Dr. Excell Seltzer and deemed stable for discharge home.  Follow-up arranged in the office.  Medication sent to [facility] pharmacy.  Educated by pharmacy prior to discharge.  Did the patient have an  acute coronary syndrome (MI, NSTEMI, STEMI, etc) this admission?:  Yes                               AHA/ACC Clinical Performance & Quality Measures: Aspirin prescribed? - Yes ADP Receptor Inhibitor (Plavix/Clopidogrel, Brilinta/Ticagrelor or Effient/Prasugrel) prescribed (includes medically managed patients)? - Yes Beta Blocker prescribed? - Yes High Intensity Statin (Lipitor 40-80mg  or Crestor 20-40mg ) prescribed? - Yes EF assessed during THIS hospitalization? - Yes For EF <40%, was ACEI/ARB prescribed? - Not Applicable (EF >/= 40%) For EF <40%, Aldosterone Antagonist (Spironolactone or Eplerenone) prescribed? - Not Applicable (EF >/= 40%) Cardiac Rehab Phase II ordered (including medically managed patients)? - Yes     The patient will be scheduled for a TOC follow up appointment in 10-14 days.  A message has been sent to the [facility] and Scheduling Pool at the office where the patient should be seen for follow up.  _____________  Discharge Vitals Blood pressure (!) 115/58, pulse 89, temperature 98.1 F (36.7 C), temperature source Oral, resp. rate 10, height  (1.499 m), weight 60 kg, SpO2 99 %.  Filed Weights   05/24/22 2135  Weight: 60 kg    Labs & Radiologic Studies    CBC Recent Labs    05/25/22 0615 05/26/22 0803 05/27/22 0509  WBC 8.5 6.6 6.0  NEUTROABS 5.8  --   --   HGB 9.7* 10.2* 10.4*  HCT 29.7* 29.6* 31.6*  MCV 93.7 89.4 91.1  PLT 275 262 278   Basic Metabolic Panel Recent Labs    37/85/88 0803 05/27/22 0509  NA 136 134*  K 3.6 3.7  CL 105 104  CO2 23 24  GLUCOSE 118* 108*  BUN 8 8  CREATININE 0.83 0.80  CALCIUM 8.8* 8.8*   Liver Function Tests No results for input(s): "AST", "ALT", "ALKPHOS", "BILITOT", "PROT", "ALBUMIN" in the last 72 hours. No results for input(s): "LIPASE", "AMYLASE" in the last 72 hours. High Sensitivity Troponin:   No results for input(s): "TROPONINIHS" in the last 720 hours.  BNP Invalid input(s): "POCBNP" D-Dimer No results for input(s): "DDIMER" in the last 72 hours. Hemoglobin A1C Recent Labs    05/24/22 2149  HGBA1C 5.9*   Fasting Lipid Panel Recent Labs    05/25/22 0615  CHOL 147  HDL 30*  LDLCALC 56  TRIG 502*  CHOLHDL 4.9   Thyroid Function Tests No results for input(s): "TSH", "T4TOTAL", "T3FREE", "THYROIDAB" in the last 72 hours.  Invalid input(s): "FREET3" _____________  CARDIAC CATHETERIZATION  Result Date: 05/27/2022   Mid LAD lesion is 85% stenosed.  A drug-eluting stent was successfully placed using a SYNERGY XD 2.50X28, postdilated to 2.75 mm and optimized with intravascular ultrasound.   Post intervention, there is a 0% residual stenosis.   1st Diag lesion is 50% stenosed.   Non-stenotic Prox RCA  lesion was previously treated.   LV end diastolic pressure is normal.   There is no aortic valve stenosis. Continue dual antiplatelet therapy for at least 1 year.  Continue aggressive secondary prevention. Hopeful for discharge later today after bedrest assuming no groin issues.    ECHOCARDIOGRAM COMPLETE  Result Date: 05/25/2022    ECHOCARDIOGRAM REPORT   Patient Name:   Kristy Heath Date of Exam: 05/25/2022 Medical Rec #:  [id number]     Height:       60.0 in Accession #:    [id number]    Weight:  132.3 lb Date of Birth:  [birth year]     BSA:          1.566 m Patient Age:    61 years      BP:           110/66 mmHg Patient Gender: F             HR:           85 bpm. Exam Location:  Inpatient Procedure: 2D Echo, Cardiac Doppler and Color Doppler Indications:    Acute myocardial infarction  History:        Patient has no prior history of Echocardiogram examinations.                 Signs/Symptoms:Chest Pain; Risk Factors:Dyslipidemia and Current                 Smoker.  Sonographer:    Ross LudwigArthur Guy RDCS (AE) Referring Phys: Elmon KirschnerOBIN FERNANDES  Sonographer Comments: Suboptimal parasternal window. IMPRESSIONS  1. Poor acoustic windows  2. Left ventricular ejection fraction, by estimation, is 55 to 60%. The left ventricle has normal function. Left ventricular endocardial border not optimally defined to evaluate regional wall motion. Left ventricular diastolic parameters are consistent with Grade I diastolic dysfunction (impaired relaxation).  3. Right ventricular systolic function was not well visualized. The right ventricular size is not well visualized.  4. The mitral valve is abnormal. No evidence of mitral valve regurgitation. No evidence of mitral stenosis.  5. The aortic valve was not well visualized. There is moderate calcification of the aortic valve. Aortic valve regurgitation is not visualized. No aortic stenosis is present.  6. The inferior vena cava is normal in size with greater than 50% respiratory  variability, suggesting right atrial pressure of 3 mmHg. Comparison(s): No prior Echocardiogram. FINDINGS  Left Ventricle: Left ventricular ejection fraction, by estimation, is 55 to 60%. The left ventricle has normal function. Left ventricular endocardial border not optimally defined to evaluate regional wall motion. The left ventricular internal cavity size was normal in size. There is no left ventricular hypertrophy. Left ventricular diastolic parameters are consistent with Grade I diastolic dysfunction (impaired relaxation). Right Ventricle: The right ventricular size is not well visualized. Right vetricular wall thickness was not well visualized. Right ventricular systolic function was not well visualized. Left Atrium: Left atrial size was normal in size. Right Atrium: Right atrial size was normal in size. Pericardium: Trivial pericardial effusion is present. Mitral Valve: The mitral valve is abnormal. There is mild calcification of the mitral valve leaflet(s). Mild to moderate mitral annular calcification. No evidence of mitral valve regurgitation. No evidence of mitral valve stenosis. Tricuspid Valve: The tricuspid valve is not well visualized. Tricuspid valve regurgitation is not demonstrated. No evidence of tricuspid stenosis. Aortic Valve: The aortic valve was not well visualized. There is moderate calcification of the aortic valve. Aortic valve regurgitation is not visualized. No aortic stenosis is present. Aortic valve mean gradient measures 6.0 mmHg. Aortic valve peak gradient measures 11.3 mmHg. Aortic valve area, by VTI measures 1.38 cm. Pulmonic Valve: The pulmonic valve was not well visualized. Pulmonic valve regurgitation is not visualized. No evidence of pulmonic stenosis. Aorta: The aortic root is normal in size and structure. Venous: The inferior vena cava is normal in size with greater than 50% respiratory variability, suggesting right atrial pressure of 3 mmHg. IAS/Shunts: The interatrial  septum was not well visualized.  LEFT VENTRICLE PLAX 2D LVIDd:  4.50 cm   Diastology LVIDs:         3.20 cm   LV e' medial:    7.83 cm/s LV PW:         0.80 cm   LV E/e' medial:  9.8 LV IVS:        1.00 cm   LV e' lateral:   10.30 cm/s LVOT diam:     1.80 cm   LV E/e' lateral: 7.5 LV SV:         41 LV SV Index:   26 LVOT Area:     2.54 cm  RIGHT VENTRICLE            IVC RV Basal diam:  3.30 cm    IVC diam: 1.60 cm RV S prime:     9.03 cm/s LEFT ATRIUM           Index        RIGHT ATRIUM           Index LA diam:      2.70 cm 1.72 cm/m   RA Area:     12.00 cm LA Vol (A2C): 22.4 ml 14.31 ml/m  RA Volume:   26.90 ml  17.18 ml/m LA Vol (A4C): 16.9 ml 10.79 ml/m  AORTIC VALVE AV Area (Vmax):    1.32 cm AV Area (Vmean):   1.38 cm AV Area (VTI):     1.38 cm AV Vmax:           168.00 cm/s AV Vmean:          111.000 cm/s AV VTI:            0.296 m AV Peak Grad:      11.3 mmHg AV Mean Grad:      6.0 mmHg LVOT Vmax:         87.40 cm/s LVOT Vmean:        60.400 cm/s LVOT VTI:          0.161 m LVOT/AV VTI ratio: 0.54  AORTA Ao Root diam: 3.20 cm MITRAL VALVE MV Area (PHT): 3.42 cm    SHUNTS MV Decel Time: 222 msec    Systemic VTI:  0.16 m MV E velocity: 77.10 cm/s  Systemic Diam: 1.80 cm MV A velocity: 78.10 cm/s MV E/A ratio:  0.99 Vishnu Priya Mallipeddi Electronically signed by Lorelee Cover Mallipeddi Signature Date/Time: 05/25/2022/10:52:38 AM    Final    CARDIAC CATHETERIZATION  Addendum Date: 05/25/2022     Prox RCA lesion is 100% stenosed.   Mid LAD lesion is 85% stenosed.   A stent was successfully placed.   Post intervention, there is a 0% residual stenosis. 1.  100% occluded proximal right coronary artery treated with 2 overlapping drug-eluting stents; due to distal embolization to the distal aspect of the RPLV system the patient will be treated with Aggrastat for 18 hours along with pain control. 2.  Residual high-grade mid LAD lesion will be staged in the coming days. 3.  LVEDP of 12 mmHg.  Recommendation: Aspirin, Brilinta, and hold beta-blockade until norepinephrine weaned off.  Continue Aggrastat for 18 hours.  Staged PCI of mid LAD on Monday.  Addendum Date: 05/25/2022     Prox RCA lesion is 100% stenosed.   Mid LAD lesion is 85% stenosed.   A stent was successfully placed.   Post intervention, there is a 0% residual stenosis. 1.  100% occluded proximal right coronary artery treated with 2 overlapping drug-eluting stents; due to  distal embolization to the distal aspect of the RPLV system the patient will be treated with Aggrastat for 18 hours along with pain control. 2.  Residual high-grade mid LAD lesion will be staged in the coming days. 3.  LVEDP of 12 mmHg. Recommendation: Aspirin, Brilinta, and hold beta-blockade for at least 24 hours.  Continue Aggrastat for 18 hours.  Staged PCI of mid LAD on Monday.  Result Date: 05/25/2022   Prox RCA lesion is 100% stenosed.   Mid LAD lesion is 85% stenosed.   A stent was successfully placed.   Post intervention, there is a 0% residual stenosis. 1.  100% occluded proximal right coronary artery treated with 2 overlapping drug-eluting stents; due to distal embolization to the distal aspect of the RPLV system the patient will be treated with Aggrastat for 12 hours along with pain control. 2.  Residual high-grade mid LAD lesion will be staged in the coming days. 3.  LVEDP of 12 mmHg. Recommendation: Aspirin, Brilinta, and hold beta-blockade for at least 24 hours.  Continue Aggrastat for 18 hours.  Staged PCI of mid LAD on Monday.   Disposition   Pt is being discharged home today in good condition.  Follow-up Plans & Appointments     Follow-up Information     Levi Aland, NP Follow up on 06/07/2022.   Specialty: Cardiology Why: at 2:20pm for your follow up appt with Dr. Romie Levee' NP Rande Brunt information: [address] [place] [address] [phone]                Discharge Instructions     AMB Referral  to Cardiac Rehabilitation - Phase II   Complete by: As directed    Diagnosis: STEMI   After initial evaluation and assessments completed: Virtual Based Care may be provided alone or in conjunction with Phase 2 Cardiac Rehab based on patient barriers.: Yes   Intensive Cardiac Rehabilitation (ICR) [facility] location only OR Traditional Cardiac Rehabilitation (TCR) *If criteria for ICR are not met will enroll in TCR [facility] only): Yes   AMB Referral to Cardiac Rehabilitation - Phase II   Complete by: As directed    Diagnosis: Coronary Stents   After initial evaluation and assessments completed: Virtual Based Care may be provided alone or in conjunction with Phase 2 Cardiac Rehab based on patient barriers.: Yes   Intensive Cardiac Rehabilitation (ICR) [facility] location only OR Traditional Cardiac Rehabilitation (TCR) *If criteria for ICR are not met will enroll in TCR [facility] only): Yes   Call MD for:  difficulty breathing, headache or visual disturbances   Complete by: As directed    Call MD for:  persistant dizziness or light-headedness   Complete by: As directed    Call MD for:  redness, tenderness, or signs of infection (pain, swelling, redness, odor or green/yellow discharge around incision site)   Complete by: As directed    Diet - low sodium heart healthy   Complete by: As directed    Discharge instructions   Complete by: As directed    Radial Site Care Refer to this sheet in the next few weeks. These instructions provide you with information on caring for yourself after your procedure. Your caregiver may also give you more specific instructions. Your treatment has been planned according to current medical practices, but problems sometimes occur. Call your caregiver if you have any problems or questions after your procedure. HOME CARE INSTRUCTIONS You may shower the day after the procedure. Remove the bandage (  dressing) and gently wash the site with plain soap and water. Gently pat the site dry.  Do not  apply powder or lotion to the site.  Do not submerge the affected site in water for 3 to 5 days.  Inspect the site at least twice daily.  Do not flex or bend the affected arm for 24 hours.  No lifting over 5 pounds (2.3 kg) for 5 days after your procedure.  Do not drive home if you are discharged the same day of the procedure. Have someone else drive you.  You may drive 24 hours after the procedure unless otherwise instructed by your caregiver.  What to expect: Any bruising will usually fade within 1 to 2 weeks.  Blood that collects in the tissue (hematoma) may be painful to the touch. It should usually decrease in size and tenderness within 1 to 2 weeks.  SEEK IMMEDIATE MEDICAL CARE IF: You have unusual pain at the radial site.  You have redness, warmth, swelling, or pain at the radial site.  You have drainage (other than a small amount of blood on the dressing).  You have chills.  You have a fever or persistent symptoms for more than 72 hours.  You have a fever and your symptoms suddenly get worse.  Your arm becomes pale, cool, tingly, or numb.  You have heavy bleeding from the site. Hold pressure on the site.   Groin Site Care Refer to this sheet in the next few weeks. These instructions provide you with information on caring for yourself after your procedure. Your caregiver may also give you more specific instructions. Your treatment has been planned according to current medical practices, but problems sometimes occur. Call your caregiver if you have any problems or questions after your procedure. HOME CARE INSTRUCTIONS You may shower 24 hours after the procedure. Remove the bandage (dressing) and gently wash the site with plain soap and water. Gently pat the site dry.  Do not apply powder or lotion to the site.  Do not sit in a bathtub, swimming pool, or whirlpool for 5 to 7 days.  No bending, squatting, or lifting anything over 10 pounds (4.5 kg) as directed by your caregiver.   Inspect the site at least twice daily.  Do not drive home if you are discharged the same day of the procedure. Have someone else drive you.  You may drive 24 hours after the procedure unless otherwise instructed by your caregiver.  What to expect: Any bruising will usually fade within 1 to 2 weeks.  Blood that collects in the tissue (hematoma) may be painful to the touch. It should usually decrease in size and tenderness within 1 to 2 weeks.  SEEK IMMEDIATE MEDICAL CARE IF: You have unusual pain at the groin site or down the affected leg.  You have redness, warmth, swelling, or pain at the groin site.  You have drainage (other than a small amount of blood on the dressing).  You have chills.  You have a fever or persistent symptoms for more than 72 hours.  You have a fever and your symptoms suddenly get worse.  Your leg becomes pale, cool, tingly, or numb.  You have heavy bleeding from the site. Hold pressure on the site. Marland Kitchen  PLEASE DO NOT MISS ANY DOSES OF YOUR BRILINTA!!!!! Also keep a log of you blood pressures and bring back to your follow up appt. Please call the office with any questions.   Patients taking blood thinners should generally stay  away from medicines like ibuprofen, Advil, Motrin, naproxen, and Aleve due to risk of stomach bleeding. You may take Tylenol as directed or talk to your primary doctor about alternatives.  Some studies suggest Prilosec/Omeprazole interacts with Plavix. We changed your Prilosec/Omeprazole to the equivalent dose of Protonix for less chance of interaction.  PLEASE ENSURE THAT YOU DO NOT RUN OUT OF YOUR BRILINTA. This medication is very important to remain on for at least one year. IF you have issues obtaining this medication due to cost please CALL the office 3-5 business days prior to running out in order to prevent missing doses of this medication.   Increase activity slowly   Complete by: As directed        Discharge Medications   Allergies  as of 05/27/2022       Reactions   Shellfish Allergy    hives        Medication List     STOP taking these medications    naproxen 500 MG tablet Commonly known as: NAPROSYN   omeprazole 20 MG capsule Commonly known as: PRILOSEC Replaced by: pantoprazole 40 MG tablet       TAKE these medications    aspirin 81 MG chewable tablet Chew 1 tablet (81 mg total) by mouth daily. What changed: how much to take   atorvastatin 80 MG tablet Commonly known as: LIPITOR Take 1 tablet (80 mg total) by mouth daily. What changed:  medication strength how much to take   cetirizine 10 MG chewable tablet Commonly known as: ZYRTEC Chew 10 mg by mouth daily.   FLAX SEED OIL PO Take by mouth.   hydrocortisone 2.5 % rectal cream Commonly known as: ANUSOL-HC Place 1 Application rectally 2 (two) times daily as needed for hemorrhoids.   levothyroxine 50 MCG tablet Commonly known as: SYNTHROID Take 50 mcg by mouth daily before breakfast.   metoprolol tartrate 25 MG tablet Commonly known as: LOPRESSOR Take 0.5 tablets (12.5 mg total) by mouth 2 (two) times daily.   nitroGLYCERIN 0.4 MG SL tablet Commonly known as: NITROSTAT Place 1 tablet (0.4 mg total) under the tongue every 5 (five) minutes x 3 doses as needed for chest pain.   pantoprazole 40 MG tablet Commonly known as: PROTONIX Take 1 tablet (40 mg total) by mouth daily. Replaces: omeprazole 20 MG capsule   Premarin 0.625 MG/GM vaginal cream Generic drug: conjugated estrogens Place 1 Applicatorful vaginally daily as needed (discomfort).   ticagrelor 90 MG Tabs tablet Commonly known as: BRILINTA Take 1 tablet (90 mg total) by mouth 2 (two) times daily.        Outstanding Labs/Studies   FLP/LFTs in 8 weeks   Duration of Discharge Encounter   Greater than 30 minutes including physician time.  Signed, Laverda PageLindsay Bekah Igoe, NP 05/27/2022, 3:07 PM

## 2022-05-27 NOTE — Plan of Care (Signed)
  Problem: Clinical Measurements: Goal: Respiratory complications will improve Outcome: Progressing   Problem: Activity: Goal: Risk for activity intolerance will decrease Outcome: Progressing   Problem: Elimination: Goal: Will not experience complications related to urinary retention Outcome: Progressing   Problem: Activity: Goal: Ability to tolerate increased activity will improve Outcome: Progressing   Problem: Coping: Goal: Level of anxiety will decrease Outcome: Not Progressing

## 2022-05-27 NOTE — TOC Benefit Eligibility Note (Signed)
Patient Teacher, English as a foreign language completed.    The patient is currently admitted and upon discharge could be taking Brilinta 90 mg.  Product Not on Formulary (Must use generic Plavix and generic Effient first)  The patient is insured through [place], [place] Patient Advocate Specialist [place] Patient Advocate Team Direct Number: [phone]  Fax: [phone]

## 2022-05-27 NOTE — H&P (View-Only) (Signed)
Rounding Note    Patient Name: Kristy Heath Date of Encounter: 05/27/2022  [place] Cardiologist: Early Osmond, MD   Subjective   Doing well. Husband at bedside. No CP or dyspnea. Eager to have next procedure done. On for staged PCI of the LAD today.   Inpatient Medications    Scheduled Meds:  aspirin  81 mg Oral Daily   atorvastatin  80 mg Oral Daily   Chlorhexidine Gluconate Cloth  6 each Topical Daily   heparin injection (subcutaneous)  5,000 Units Subcutaneous Q8H   levothyroxine  50 mcg Oral QAC breakfast   metoprolol tartrate  12.5 mg Oral BID   pantoprazole  40 mg Oral Daily   sodium chloride flush  3 mL Intravenous Q12H   sodium chloride flush  3 mL Intravenous Q12H   ticagrelor  90 mg Oral BID   Continuous Infusions:  sodium chloride     sodium chloride     sodium chloride 1 mL/kg/hr (05/27/22 0800)   norepinephrine (LEVOPHED) Adult infusion Stopped (05/25/22 0720)   PRN Meds: sodium chloride, sodium chloride, acetaminophen, morphine injection, nitroGLYCERIN, ondansetron (ZOFRAN) IV, sodium chloride flush, sodium chloride flush   Vital Signs    Vitals:   05/26/22 1933 05/26/22 2353 05/27/22 0429 05/27/22 0720  BP: 118/65 90/70 95/64  100/67  Pulse: (!) 107 88 92 87  Resp: 20 16 15 16   Temp: 98.5 F (36.9 C) 98.8 F (37.1 C) 98.1 F (36.7 C) 98.1 F (36.7 C)  TempSrc: Oral Oral Oral Oral  SpO2: 98% 97% 98% 98%  Weight:      Height:    4\' 11"  (1.499 m)    Intake/Output Summary (Last 24 hours) at 05/27/2022 0855 Last data filed at 05/27/2022 0646 Gross per 24 hour  Intake 436.8 ml  Output 375 ml  Net 61.8 ml      05/24/2022    9:35 PM 06/02/2018    3:21 PM 12/27/2016   11:17 AM  Last 3 Weights  Weight (lbs) 132 lb 4.4 oz 132 lb 132 lb 3.2 oz  Weight (kg) 60 kg 59.875 kg 59.966 kg      Telemetry    Sinus rhythm/sinus tach - Personally Reviewed   Physical Exam  Alert, oriented, NAD GEN: No acute distress.   Neck: No  JVD Cardiac: RRR, no murmurs, rubs, or gallops.  Respiratory: Clear to auscultation bilaterally. GI: Soft, nontender, non-distended  MS: No lower extremity edema; No deformity. Right arm edema/ecchymoses noted throughout Neuro:  Nonfocal  Psych: Normal affect   Labs    High Sensitivity Troponin:  No results for input(s): "TROPONINIHS" in the last 720 hours.   Chemistry Recent Labs  Lab 05/25/22 0615 05/26/22 0803 05/27/22 0509  NA 138 136 134*  K 3.8 3.6 3.7  CL 109 105 104  CO2 20* 23 24  GLUCOSE 113* 118* 108*  BUN 10 8 8   CREATININE 0.75 0.83 0.80  CALCIUM 8.2* 8.8* 8.8*  GFRNONAA >60 >60 >60  ANIONGAP 9 8 6     Lipids  Recent Labs  Lab 05/25/22 0615  CHOL 147  TRIG 307*  HDL 30*  LDLCALC 56  CHOLHDL 4.9    Hematology Recent Labs  Lab 05/25/22 0615 05/26/22 0803 05/27/22 0509  WBC 8.5 6.6 6.0  RBC 3.17* 3.31* 3.47*  HGB 9.7* 10.2* 10.4*  HCT 29.7* 29.6* 31.6*  MCV 93.7 89.4 91.1  MCH 30.6 30.8 30.0  MCHC 32.7 34.5 32.9  RDW 12.9 13.0 12.8  PLT  275 262 278   Thyroid No results for input(s): "TSH", "FREET4" in the last 168 hours.  BNPNo results for input(s): "BNP", "PROBNP" in the last 168 hours.  DDimer No results for input(s): "DDIMER" in the last 168 hours.   Radiology    No results found.   Patient Profile     62 y.o. female with PMH carotid stenosis, hyperlipidemia, current tobacco use, family history of CAD who was brought to cath lab 05/24/22 for inferior STEMI from [facility]. Course complicated by radial hematoma at access site while on aggrastat.   Assessment & Plan    Inferior STEMI: presented 1/5 and underwent primary PCI of the RCA. On ASA/ticagrelor/high intensity statin. LVEF preserved. For staged PCI of the proximal LAD today. Reviewed films and discussed case with Dr Varanasi who is doing her procedure. Tolerating low-dose metoprolol but BP will not allow for further med titration such as ACE/ARB Tobacco: extensive cessation  counseling done. She has used Chantix in the past. Not interested in nicotine replacement therapy. Will quit 'cold turkey.' Hyperlipidemia: started on high intensity statin with atorva 80 mg 4. Arm hematoma: related to 2B3a use for coronary thrombus/embolization. Overall appears stable. HgB 10.4 - stable.   Dispo: possibly home today pending PCI result.       For questions or updates, please contact [facility] Please consult [email or web address] for contact info under        Signed, Kanani Mowbray, MD  05/27/2022, 8:55 AM    

## 2022-05-27 NOTE — Progress Notes (Signed)
CARDIAC REHAB PHASE I   PRE:  Rate/Rhythm: 100 ST    BP: sitting 106/67    SaO2: 99 RA  MODE:  Ambulation: 400 ft   POST:  Rate/Rhythm: 118 ST    BP: sitting 117/95     SaO2:   Pt preparing for cath lab. Did not ambulate since Saturday. To BR then ambulated hall independently. No c/o. Sts she has not had the chest tightness again like she had Saturday. Will f/u later today to finish education with pt and husband. [phone]   Yves Dill BS, ACSM-CEP 05/27/2022 9:03 AM

## 2022-05-27 NOTE — Progress Notes (Signed)
Transported to the cath lab by bed awake and alert. 

## 2022-05-27 NOTE — Progress Notes (Signed)
Spoke with Dr. Irish Lack by phone, informed there is no DC order for home, he stated that Dr. Burt Knack is to see pt and check groin and place DC order into computer, Short Stay made aware, safety maintained

## 2022-05-28 ENCOUNTER — Encounter (HOSPITAL_COMMUNITY): Payer: Self-pay | Admitting: Interventional Cardiology

## 2022-05-28 LAB — LIPOPROTEIN A (LPA): Lipoprotein (a): 101.8 nmol/L — ABNORMAL HIGH (ref <75.0)

## 2022-05-31 ENCOUNTER — Telehealth: Payer: Self-pay | Admitting: Internal Medicine

## 2022-05-31 DIAGNOSIS — L509 Urticaria, unspecified: Secondary | ICD-10-CM | POA: Diagnosis not present

## 2022-05-31 DIAGNOSIS — R030 Elevated blood-pressure reading, without diagnosis of hypertension: Secondary | ICD-10-CM | POA: Diagnosis not present

## 2022-05-31 DIAGNOSIS — T148XXA Other injury of unspecified body region, initial encounter: Secondary | ICD-10-CM | POA: Diagnosis not present

## 2022-05-31 DIAGNOSIS — Z6827 Body mass index (BMI) 27.0-27.9, adult: Secondary | ICD-10-CM | POA: Diagnosis not present

## 2022-05-31 DIAGNOSIS — R69 Illness, unspecified: Secondary | ICD-10-CM | POA: Diagnosis not present

## 2022-05-31 MED ORDER — CLOPIDOGREL BISULFATE 75 MG PO TABS: 75.0000 mg | ORAL_TABLET | Freq: Every day | ORAL | 3 refills | Status: DC

## 2022-05-31 NOTE — Telephone Encounter (Signed)
Spoke with pt who recently was started on Brilinta (05/24/22) after having stent placement.  She is reporting having hives "all over" her body.  She reports they are all over her chest, down arms, at all "hot spots", across her stomach, back and legs,  she noticed itching yesterday.  Didn't think too much about it but then over night, she could not sleep the itching was so intense.  She saw her PCP who did not stop the medication but did give her a shot of something and started her on prednisone.  She is also taking Benadryl.  Advised I will review with MD and call back.

## 2022-05-31 NOTE — Telephone Encounter (Signed)
Pt c/o medication issue:  1. Name of Medication:  ticagrelor (BRILINTA) 90 MG TABS tablet  2. How are you currently taking this medication (dosage and times per day)?  Twice daily as prescribed   3. Are you having a reaction (difficulty breathing--STAT)?   4. What is your medication issue?   Patient states she broke out in hives and assumes Brilinta may be the cause.

## 2022-05-31 NOTE — Telephone Encounter (Signed)
Reviewed information with DOD - Dr Acie Fredrickson who gives orders to d/c Brilinta and start Plavix 300 mg x 1 dose then 1 tablet daily thereafter.  Reviewed with pt who states understanding.  RX sent into [facility].  Pt will call back if any further questions or concerns.  Will forward this information to Dr Ali Lowe for his knowledge.

## 2022-06-05 NOTE — Progress Notes (Signed)
Cardiology Office Note:    Date:  06/07/2022   ID:  Kristy Heath, DOB March 15, 1961, MRN [id number]  PCP:  Kristy Pandy, MD   [facility] Providers Cardiologist:  Kristy Pyo, MD     Referring MD: Kristy Pandy, MD   Chief Complaint: hospital follow-up post PCI  History of Present Illness:    Kristy Heath is a very pleasant 62 y.o. female with a hx of recent STEMI, CAD with PCI/DES to RCA x 2, PCI/DES to LAD x 1, carotid artery disease (RICA less than 50% stenosis, LICA 50% stenosis, and partial flow reversal in the LVA) suggestion of left subclavian stenosis, HLD, tobacco abuse, and family hx of premature CAD.  Previously seen by Dr. Purvis Sheffield for carotid stenosis. There was a question of left subclavian artery stenosis. Seen by Dr. Chestine Spore, [facility] 06/02/2018. She has had no significant progression of carotid disease on her duplex and remains asymptomatic. In addition other than slightly weaker left radial pulse compared to right no indication for intervention on previously suspected subclavian pathology (previously had partial flow reversal) antegrade flow in left vertebral artery on duplex today. Plan to repeat duplex in 1 year  On 05/24/22, she presented to [facility] for chest pain, sweating, dizziness, and not feeling well. She had exercised earlier in the day doing elliptical for 6 to 10 minutes and started having a weird feeling in her chest which she cannot describe, then noted to have nausea, chest pain, sweating. She did not feel right and became dizzy, did not pass out.  Given her symptoms, went to [facility] where EKG showed inferior STEMI. She was transferred to [facility] and taken directly to cath lab. She was on norepinephrine on arrival.   Left heart catheterization 05/24/22 revealed 100% occluded proximal RCA occlusion treated with PCI/DES x 2 with distal embolization of the RPLV and placed on Aggrastat for 18 hours. Had mid LAD lesion 85% stenosis. She underwent mid LAD  lesion with staged intervention on 1/8 with DES x 1.  Documentation for DAPT with aspirin/Brilinta for at least 1 year.  Follow-up echocardiogram showed LVEF 55 to 60%, grade 1 diastolic dysfunction, no significant valvular disease.  Given her insurance, she will need to switch to Plavix or Effient after her initial 30 days of Brilinta. She developed a hematoma after cath that was stable at d/c. Additionally discharged on atorvastatin 80 mg daily, low dose BB, and omeprazole was changed to pantoprazole.   Today, she is here with her husband for follow-up. Reports she is doing well, continues to have mild right upper extremity pain s/p hematoma. Most noticeable when she is changing positions during sleep. She had an allergic reaction to Brilinta and was changed to Plavix. Has not been very active but is ready to start walking on her treadmill for exercise. She denies chest pain, shortness of breath, lower extremity edema, fatigue, palpitations, melena, hematuria, hemoptysis, diaphoresis, weakness, presyncope, syncope, orthopnea, and PND. Felt a slight flutter in her chest today because today was the most active that she has been. Lengthy discussion about history of carotid artery disease and importance of healthy diet, good cholesterol and BP control, and regular exercise for CV risk reduction. She asks why she was allowed to have high cholesterol and only treated with low dose statin. Not interested in cardiac rehab, wants to work out at home.   Past Medical History:  Diagnosis Date   Carotid artery disease (HCC)    Hypertension  Mixed hyperlipidemia     Past Surgical History:  Procedure Laterality Date   CESAREAN SECTION     x 2   COLONOSCOPY N/A 04/06/2015   Procedure: COLONOSCOPY;  Surgeon: Kristy Houston, MD;  Location: [facility];  Service: Endoscopy;  Laterality: N/A;  1:00   CORONARY STENT INTERVENTION N/A 05/27/2022   Procedure: CORONARY STENT INTERVENTION;  Surgeon: Kristy Booze, MD;  Location: [place] [facility];  Service: Cardiovascular;  Laterality: N/A;   CORONARY/GRAFT ACUTE MI REVASCULARIZATION N/A 05/24/2022   Procedure: Coronary/Graft Acute MI Revascularization;  Surgeon: Kristy Osmond, MD;  Location: [place] [facility];  Service: Cardiovascular;  Laterality: N/A;   INTRAVASCULAR ULTRASOUND/IVUS N/A 05/27/2022   Procedure: Intravascular Ultrasound/IVUS;  Surgeon: Kristy Booze, MD;  Location: [place] [facility] LAB;  Service: Cardiovascular;  Laterality: N/A;   LEFT HEART CATH AND CORONARY ANGIOGRAPHY N/A 05/24/2022   Procedure: LEFT HEART CATH AND CORONARY ANGIOGRAPHY;  Surgeon: Kristy Osmond, MD;  Location: [place] [facility] LAB;  Service: Cardiovascular;  Laterality: N/A;   LEFT HEART CATH AND CORONARY ANGIOGRAPHY N/A 05/27/2022   Procedure: LEFT HEART CATH AND CORONARY ANGIOGRAPHY;  Surgeon: Kristy Booze, MD;  Location: [place] [facility];  Service: Cardiovascular;  Laterality: N/A;   NASAL SEPTUM SURGERY      Current Medications: Current Meds  Medication Sig   aspirin 81 MG chewable tablet Chew 1 tablet (81 mg total) by mouth daily.   atorvastatin (LIPITOR) 80 MG tablet Take 1 tablet (80 mg total) by mouth daily.   cetirizine (ZYRTEC) 10 MG chewable tablet Chew 10 mg by mouth daily.   clopidogrel (PLAVIX) 75 MG tablet Take 1 tablet (75 mg total) by mouth daily. Take (4)tabs - 300 mg 1st day then (1) tablet 75 mg daily thereafter   conjugated estrogens (PREMARIN) vaginal cream Place 1 Applicatorful vaginally daily as needed (discomfort).   Flaxseed, Linseed, (FLAX SEED OIL PO) Take by mouth.   hydrocortisone (ANUSOL-HC) 2.5 % rectal cream Place 1 Application rectally 2 (two) times daily as needed for hemorrhoids.   levothyroxine (SYNTHROID, LEVOTHROID) 50 MCG tablet Take 50 mcg by mouth daily before breakfast.   metoprolol tartrate (LOPRESSOR) 25 MG tablet Take 0.5 tablets (12.5 mg total) by mouth 2 (two) times daily.   nitroGLYCERIN (NITROSTAT)  0.4 MG SL tablet Place 1 tablet (0.4 mg total) under the tongue every 5 (five) minutes x 3 doses as needed for chest pain.   pantoprazole (PROTONIX) 40 MG tablet Take 1 tablet (40 mg total) by mouth daily.     Allergies:   Brilinta [ticagrelor] and Shellfish allergy   Social History   Socioeconomic History   Marital status: Married    Spouse name: Not on file   Number of children: Not on file   Years of education: Not on file   Highest education level: Not on file  Occupational History   Not on file  Tobacco Use   Smoking status: Former    Packs/day: 0.50    Types: Cigarettes    Start date: 07/29/1976   Smokeless tobacco: Never   Tobacco comments:    less 1/2 pack x 20 yrs  Substance and Sexual Activity   Alcohol use: No    Alcohol/week: 0.0 standard drinks of alcohol   Drug use: No   Sexual activity: Not on file  Other Topics Concern   Not on file  Social History Narrative   Not on file   Social Determinants  of Health   Financial Resource Strain: Not on file  Food Insecurity: Not on file  Transportation Needs: Not on file  Physical Activity: Not on file  Stress: Not on file  Social Connections: Not on file     Family History: The patient's family history includes Heart disease in her father; Hypertension in her brother.  ROS:   Please see the history of present illness.    + RUE bruising All other systems reviewed and are negative.  Labs/Other Studies Reviewed:    The following studies were reviewed today:  Coronary Stent Intervention 05/27/22    Mid LAD lesion is 85% stenosed.  A drug-eluting stent was successfully placed using a SYNERGY XD 2.50X28, postdilated to 2.75 mm and optimized with intravascular ultrasound.   Post intervention, there is a 0% residual stenosis.   1st Diag lesion is 50% stenosed.   Non-stenotic Prox RCA lesion was previously treated.   LV end diastolic pressure is normal.   There is no aortic valve stenosis.   Continue dual  antiplatelet therapy for at least 1 year.  Continue aggressive secondary prevention.    Hopeful for discharge later today after bedrest assuming no groin issues.    CARDIAC CATHETERIZATION 05/24/22     Prox RCA lesion is 100% stenosed.   Mid LAD lesion is 85% stenosed.   A stent was successfully placed.   Post intervention, there is a 0% residual stenosis.   1.  100% occluded proximal right coronary artery treated with 2 overlapping drug-eluting stents; due to distal embolization to the distal aspect of the RPLV system the patient will be treated with Aggrastat for 18 hours along with pain control. 2.  Residual high-grade mid LAD lesion will be staged in the coming days. 3.  LVEDP of 12 mmHg.   Recommendation: Aspirin, Brilinta, and hold beta-blockade until norepinephrine weaned off.  Continue Aggrastat for 18 hours.  Staged PCI of mid LAD on Monday.  Diagnostic Dominance: Right  Intervention     Echo 05/25/22 1. Poor acoustic windows   2. Left ventricular ejection fraction, by estimation, is 55 to 60%. The  left ventricle has normal function. Left ventricular endocardial border  not optimally defined to evaluate regional wall motion. Left ventricular  diastolic parameters are consistent  with Grade I diastolic dysfunction (impaired relaxation).   3. Right ventricular systolic function was not well visualized. The right  ventricular size is not well visualized.   4. The mitral valve is abnormal. No evidence of mitral valve  regurgitation. No evidence of mitral stenosis.   5. The aortic valve was not well visualized. There is moderate  calcification of the aortic valve. Aortic valve regurgitation is not  visualized. No aortic stenosis is present.   6. The inferior vena cava is normal in size with greater than 50%  respiratory variability, suggesting right atrial pressure of 3 mmHg.   Comparison(s): No prior Echocardiogram.   Recent Labs: 05/27/2022: BUN 8; Creatinine, Ser 0.80;  Hemoglobin 10.4; Platelets 278; Potassium 3.7; Sodium 134  Recent Lipid Panel    Component Value Date/Time   CHOL 147 05/25/2022 0615   TRIG 307 (H) 05/25/2022 0615   HDL 30 (L) 05/25/2022 0615   CHOLHDL 4.9 05/25/2022 0615   VLDL 61 (H) 05/25/2022 0615   LDLCALC 56 05/25/2022 0615     Risk Assessment/Calculations:       Physical Exam:    VS:  BP 108/62   Pulse 90   Ht 5' (1.524 m)  Wt 139 lb 6.4 oz (63.2 kg)   SpO2 97%   BMI 27.22 kg/m     Wt Readings from Last 3 Encounters:  06/07/22 139 lb 6.4 oz (63.2 kg)  05/24/22 132 lb 4.4 oz (60 kg)  06/02/18 132 lb (59.9 kg)     GEN:  Well nourished, well developed in no acute distress HEENT: Normal NECK: No JVD; No carotid bruits CARDIAC: RRR, no murmurs, rubs, gallops RESPIRATORY:  Clear to auscultation without rales, wheezing or rhonchi  ABDOMEN: Soft, non-tender, non-distended MUSCULOSKELETAL:  No edema; No deformity. 2+ pedal pulses, equal bilaterally SKIN: Warm and dry NEUROLOGIC:  Alert and oriented x 3 PSYCHIATRIC:  Normal affect   EKG:  EKG is ordered today.  The ekg ordered today demonstrates normal sinus rhythm at 90 bpm, TWI leads II, III, V4-V6, no significant change from previous tracing   Diagnoses:    1. ST elevation myocardial infarction involving right coronary artery (HCC)   2. S/P coronary artery stent placement   3. Coronary artery disease involving native coronary artery of native heart without angina pectoris   4. Bilateral carotid artery stenosis   5. Hyperlipidemia LDL goal <70   6. Essential hypertension   7. Tobacco use    Assessment and Plan:     CAD without angina: S/p STEMI 05/24/22 with 100% occlusion RCA treated with 2 overlapping DES, Aggrastat x 18 hrs for distal embolization to distal aspect of RPLV system, mLAD lesion 85% stenosis with staged PCI/DES x 1 on 1/8. She denies chest pain, dyspnea, or other symptoms concerning for angina. Switched from Brilinta to Plavix 2/2  hives.Tolerating current medications without adverse side effects. No bleeding problems. Continue metoprolol, clopidogrel, atorvastatin, aspirin.  Hyperlipidemia LDL goal < 55: Lipoprotein a 101.8, LDL 56, triglycerides 307 on 05/25/22. Concern about untreated hyperlipidemia in the past. I do not have past labs to review. Lengthy discussion about cholesterol goals and that she is currently well controlled. Encouraged heart healthy, mostly plant-based diet and 150 minutes of moderate intensity exercise each week. Continue atorvastatin.  Carotid artery stenosis: Carotid duplex 05/2018 with 1 to 39% stenosis LICA, 40 to 59% stenosis RICA. Says she was told by provider that she did not need to return. Advised we can order carotid duplex at any time for monitoring. No s/s of TIA. She will defer repeat study for now. Advised her to notify us if she has any concerning symptoms prior to next office visit.  Tobacco abuse: Not specifically addressed today.  Hypertension: BP is well controlled.     Cardiac Rehabilitation Eligibility Assessment  The patient has declined or is not appropriate for cardiac rehabilitation.      Disposition: 3-4 months with Dr. Clifton James or me  Medication Adjustments/Labs and Tests Ordered: Current medicines are reviewed at length with the patient today.  Concerns regarding medicines are outlined above.  Orders Placed This Encounter  Procedures   CBC   Basic Metabolic Panel (BMET)   EKG 12-Lead   No orders of the defined types were placed in this encounter.   Patient Instructions  Medication Instructions:   Your physician recommends that you continue on your current medications as directed. Please refer to the Current Medication list given to you today.   *If you need a refill on your cardiac medications before your next appointment, please call your pharmacy*   Lab Work:  TODAY!!!!! CBC/BMET  If you have labs (blood work) drawn today and your tests are  completely normal, you will  receive your results only by: [place] (if you have MyChart) OR A paper copy in the mail If you have any lab test that is abnormal or we need to change your treatment, we will call you to review the results.   Testing/Procedures:  None ordered.   Follow-Up: At [facility], you and your health needs are our priority.  As part of our continuing mission to provide you with exceptional heart care, we have created designated Provider Care Teams.  These Care Teams include your primary Cardiologist (physician) and Advanced Practice Providers (APPs -  Physician Assistants and Nurse Practitioners) who all work together to provide you with the care you need, when you need it.  We recommend signing up for the patient portal called "MyChart".  Sign up information is provided on this After Visit Summary.  MyChart is used to connect with patients for Virtual Visits (Telemedicine).  Patients are able to view lab/test results, encounter notes, upcoming appointments, etc.  Non-urgent messages can be sent to your provider as well.   To learn more about what you can do with MyChart, go to [email or web address].    Your next appointment:   3 month(s)  Provider:   Christen Bame, NP         Other Instructions  Maximum Heart Rate 220 - Age.    Signed, Emmaline Life, NP  06/07/2022 4:41 PM    [place]

## 2022-06-07 ENCOUNTER — Ambulatory Visit: Payer: 59 | Attending: Nurse Practitioner | Admitting: Nurse Practitioner

## 2022-06-07 ENCOUNTER — Encounter: Payer: Self-pay | Admitting: Nurse Practitioner

## 2022-06-07 VITALS — BP 108/62 | HR 90 | Ht 60.0 in | Wt 139.4 lb

## 2022-06-07 DIAGNOSIS — E785 Hyperlipidemia, unspecified: Secondary | ICD-10-CM

## 2022-06-07 DIAGNOSIS — I2510 Atherosclerotic heart disease of native coronary artery without angina pectoris: Secondary | ICD-10-CM | POA: Diagnosis not present

## 2022-06-07 DIAGNOSIS — Z955 Presence of coronary angioplasty implant and graft: Secondary | ICD-10-CM

## 2022-06-07 DIAGNOSIS — I2111 ST elevation (STEMI) myocardial infarction involving right coronary artery: Secondary | ICD-10-CM

## 2022-06-07 DIAGNOSIS — I10 Essential (primary) hypertension: Secondary | ICD-10-CM

## 2022-06-07 DIAGNOSIS — Z720 Tobacco use: Secondary | ICD-10-CM

## 2022-06-07 DIAGNOSIS — I6523 Occlusion and stenosis of bilateral carotid arteries: Secondary | ICD-10-CM | POA: Diagnosis not present

## 2022-06-07 NOTE — Patient Instructions (Signed)
Medication Instructions:   Your physician recommends that you continue on your current medications as directed. Please refer to the Current Medication list given to you today.   *If you need a refill on your cardiac medications before your next appointment, please call your pharmacy*   Lab Work:  TODAY!!!!! CBC/BMET  If you have labs (blood work) drawn today and your tests are completely normal, you will receive your results only by: [place] (if you have MyChart) OR A paper copy in the mail If you have any lab test that is abnormal or we need to change your treatment, we will call you to review the results.   Testing/Procedures:  None ordered.   Follow-Up: At [facility], you and your health needs are our priority.  As part of our continuing mission to provide you with exceptional heart care, we have created designated Provider Care Teams.  These Care Teams include your primary Cardiologist (physician) and Advanced Practice Providers (APPs -  Physician Assistants and Nurse Practitioners) who all work together to provide you with the care you need, when you need it.  We recommend signing up for the patient portal called "MyChart".  Sign up information is provided on this After Visit Summary.  MyChart is used to connect with patients for Virtual Visits (Telemedicine).  Patients are able to view lab/test results, encounter notes, upcoming appointments, etc.  Non-urgent messages can be sent to your provider as well.   To learn more about what you can do with MyChart, go to [email or web address].    Your next appointment:   3 month(s)  Provider:   Christen Bame, NP         Other Instructions  Maximum Heart Rate 220 - Age.

## 2022-06-08 LAB — BASIC METABOLIC PANEL
BUN/Creatinine Ratio: 20 (ref 12–28)
BUN: 20 mg/dL (ref 8–27)
CO2: 26 mmol/L (ref 20–29)
Calcium: 10 mg/dL (ref 8.7–10.3)
Chloride: 99 mmol/L (ref 96–106)
Creatinine, Ser: 0.98 mg/dL (ref 0.57–1.00)
Glucose: 101 mg/dL — ABNORMAL HIGH (ref 70–99)
Potassium: 4.4 mmol/L (ref 3.5–5.2)
Sodium: 140 mmol/L (ref 134–144)
eGFR: 65 mL/min/{1.73_m2} (ref >59)

## 2022-06-08 LAB — CBC
Hematocrit: 35.7 % (ref 34.0–46.6)
Hemoglobin: 12.1 g/dL (ref 11.1–15.9)
MCH: 30.2 pg (ref 26.6–33.0)
MCHC: 33.9 g/dL (ref 31.5–35.7)
MCV: 89 fL (ref 79–97)
Platelets: 722 10*3/uL — ABNORMAL HIGH (ref 150–450)
RBC: 4.01 x10E6/uL (ref 3.77–5.28)
RDW: 12.8 % (ref 11.7–15.4)
WBC: 8.8 10*3/uL (ref 3.4–10.8)

## 2022-06-10 ENCOUNTER — Telehealth: Payer: Self-pay | Admitting: *Deleted

## 2022-06-10 DIAGNOSIS — Z79899 Other long term (current) drug therapy: Secondary | ICD-10-CM

## 2022-06-10 NOTE — Telephone Encounter (Signed)
See result note.  

## 2022-06-18 ENCOUNTER — Other Ambulatory Visit (HOSPITAL_COMMUNITY): Payer: Self-pay

## 2022-06-24 ENCOUNTER — Ambulatory Visit: Payer: 59 | Attending: Internal Medicine

## 2022-06-24 ENCOUNTER — Telehealth: Payer: Self-pay | Admitting: Internal Medicine

## 2022-06-24 DIAGNOSIS — Z79899 Other long term (current) drug therapy: Secondary | ICD-10-CM | POA: Diagnosis not present

## 2022-06-24 LAB — CBC
Hematocrit: 37.8 % (ref 34.0–46.6)
Hemoglobin: 12.4 g/dL (ref 11.1–15.9)
MCH: 29.5 pg (ref 26.6–33.0)
MCHC: 32.8 g/dL (ref 31.5–35.7)
MCV: 90 fL (ref 79–97)
Platelets: 292 10*3/uL (ref 150–450)
RBC: 4.2 x10E6/uL (ref 3.77–5.28)
RDW: 13.7 % (ref 11.7–15.4)
WBC: 4.3 10*3/uL (ref 3.4–10.8)

## 2022-06-24 NOTE — Telephone Encounter (Signed)
Patient came in today to reschedule her appt on 09/03/22 with Christen Bame her at [place] to either the [place] office.   Patient is followed by Dr Ali Lowe due to cath and essentially wants to transfer her care from [facility] to [place] office. This is due to travel time and insurance saying that her care is OON if she comes to [employer]? Is it ok to transfer care to this office?

## 2022-06-25 NOTE — Telephone Encounter (Signed)
Patient is scheduled to see Finis Bud NP in [place] on 09/03/22 @ 10:30am.

## 2022-06-26 ENCOUNTER — Other Ambulatory Visit: Payer: Self-pay

## 2022-06-26 DIAGNOSIS — I2510 Atherosclerotic heart disease of native coronary artery without angina pectoris: Secondary | ICD-10-CM | POA: Diagnosis not present

## 2022-06-26 DIAGNOSIS — Z6828 Body mass index (BMI) 28.0-28.9, adult: Secondary | ICD-10-CM | POA: Diagnosis not present

## 2022-06-26 DIAGNOSIS — S40021A Contusion of right upper arm, initial encounter: Secondary | ICD-10-CM | POA: Diagnosis not present

## 2022-06-26 MED ORDER — METOPROLOL TARTRATE 25 MG PO TABS: 12.5000 mg | ORAL_TABLET | Freq: Two times a day (BID) | ORAL | 3 refills | Status: DC

## 2022-07-22 DIAGNOSIS — Z6828 Body mass index (BMI) 28.0-28.9, adult: Secondary | ICD-10-CM | POA: Diagnosis not present

## 2022-07-22 DIAGNOSIS — R1011 Right upper quadrant pain: Secondary | ICD-10-CM | POA: Diagnosis not present

## 2022-07-22 DIAGNOSIS — F17210 Nicotine dependence, cigarettes, uncomplicated: Secondary | ICD-10-CM | POA: Diagnosis not present

## 2022-07-22 DIAGNOSIS — R030 Elevated blood-pressure reading, without diagnosis of hypertension: Secondary | ICD-10-CM | POA: Diagnosis not present

## 2022-07-22 DIAGNOSIS — M549 Dorsalgia, unspecified: Secondary | ICD-10-CM | POA: Diagnosis not present

## 2022-07-23 ENCOUNTER — Other Ambulatory Visit (HOSPITAL_COMMUNITY): Payer: Self-pay | Admitting: Family Medicine

## 2022-07-23 DIAGNOSIS — R1011 Right upper quadrant pain: Secondary | ICD-10-CM

## 2022-07-31 ENCOUNTER — Ambulatory Visit (HOSPITAL_COMMUNITY)
Admission: RE | Admit: 2022-07-31 | Discharge: 2022-07-31 | Disposition: A | Discharge status: Home / Self Care | Payer: 59 | Source: Ambulatory Visit | Attending: Family Medicine | Admitting: Family Medicine

## 2022-07-31 DIAGNOSIS — R1011 Right upper quadrant pain: Secondary | ICD-10-CM | POA: Diagnosis not present

## 2022-07-31 DIAGNOSIS — K828 Other specified diseases of gallbladder: Secondary | ICD-10-CM | POA: Diagnosis not present

## 2022-09-02 DIAGNOSIS — I2510 Atherosclerotic heart disease of native coronary artery without angina pectoris: Secondary | ICD-10-CM | POA: Diagnosis not present

## 2022-09-02 DIAGNOSIS — I252 Old myocardial infarction: Secondary | ICD-10-CM | POA: Diagnosis not present

## 2022-09-02 DIAGNOSIS — R030 Elevated blood-pressure reading, without diagnosis of hypertension: Secondary | ICD-10-CM | POA: Diagnosis not present

## 2022-09-02 DIAGNOSIS — Z6827 Body mass index (BMI) 27.0-27.9, adult: Secondary | ICD-10-CM | POA: Diagnosis not present

## 2022-09-02 DIAGNOSIS — K219 Gastro-esophageal reflux disease without esophagitis: Secondary | ICD-10-CM | POA: Diagnosis not present

## 2022-09-02 DIAGNOSIS — F17210 Nicotine dependence, cigarettes, uncomplicated: Secondary | ICD-10-CM | POA: Diagnosis not present

## 2022-09-03 ENCOUNTER — Ambulatory Visit: Payer: 59 | Attending: Nurse Practitioner | Admitting: Nurse Practitioner

## 2022-09-03 ENCOUNTER — Encounter: Payer: Self-pay | Admitting: Nurse Practitioner

## 2022-09-03 ENCOUNTER — Ambulatory Visit: Payer: 59 | Admitting: Nurse Practitioner

## 2022-09-03 VITALS — BP 130/78 | HR 77 | Ht 60.0 in | Wt 142.0 lb

## 2022-09-03 DIAGNOSIS — E785 Hyperlipidemia, unspecified: Secondary | ICD-10-CM

## 2022-09-03 DIAGNOSIS — I739 Peripheral vascular disease, unspecified: Secondary | ICD-10-CM | POA: Diagnosis not present

## 2022-09-03 DIAGNOSIS — I10 Essential (primary) hypertension: Secondary | ICD-10-CM

## 2022-09-03 DIAGNOSIS — I2510 Atherosclerotic heart disease of native coronary artery without angina pectoris: Secondary | ICD-10-CM

## 2022-09-03 DIAGNOSIS — I6523 Occlusion and stenosis of bilateral carotid arteries: Secondary | ICD-10-CM | POA: Diagnosis not present

## 2022-09-03 NOTE — Patient Instructions (Signed)
Medication Instructions:  ?Your physician recommends that you continue on your current medications as directed. Please refer to the Current Medication list given to you today. ? ?Labwork: ?none ? ?Testing/Procedures: ?Your physician has requested that you have a carotid duplex. This test is an ultrasound of the carotid arteries in your neck. It looks at blood flow through these arteries that supply the brain with blood. Allow one hour for this exam. There are no restrictions or special instructions.  ? ?Follow-Up: ?Your physician recommends that you schedule a follow-up appointment in: 6 months ? ?Any Other Special Instructions Will Be Listed Below (If Applicable). ? ?If you need a refill on your cardiac medications before your next appointment, please call your pharmacy. ? ?

## 2022-09-03 NOTE — Progress Notes (Signed)
Office Visit    Patient Name: Kristy Heath Date of Encounter: 09/03/2022  PCP:  Estanislado Pandy, MD    [facility]  Cardiologist:  Orbie Pyo, MD  Advanced Practice Provider:  No care team member to display Electrophysiologist:  None 6}  Chief Complaint    Kristy Heath is a 62 y.o. female with a hx of CAD, s/p STEMI, hyperlipidemia, family history of premature CAD, carotid artery disease, left subclavian stenosis, and tobacco abuse, who presents today for 41-month follow-up.  Past Medical History    Past Medical History:  Diagnosis Date   Carotid artery disease    Hypertension    Mixed hyperlipidemia    Past Surgical History:  Procedure Laterality Date   CESAREAN SECTION     x 2   COLONOSCOPY N/A 04/06/2015   Procedure: COLONOSCOPY;  Surgeon: Malissa Hippo, MD;  [facility];  Service: Endoscopy;  Laterality: N/A;  1:00   CORONARY STENT INTERVENTION N/A 05/27/2022   Procedure: CORONARY STENT INTERVENTION;  Surgeon: Corky Crafts, MD;  Location: [facility] INVASIVE CV LAB;  Service: Cardiovascular;  Laterality: N/A;   CORONARY ULTRASOUND/IVUS N/A 05/27/2022   Procedure: Intravascular Ultrasound/IVUS;  Surgeon: Corky Crafts, MD;  Location: [facility] INVASIVE CV LAB;  Service: Cardiovascular;  Laterality: N/A;   CORONARY/GRAFT ACUTE MI REVASCULARIZATION N/A 05/24/2022   Procedure: Coronary/Graft Acute MI Revascularization;  Surgeon: Orbie Pyo, MD;  Location: [facility] INVASIVE CV LAB;  Service: Cardiovascular;  Laterality: N/A;   LEFT HEART CATH AND CORONARY ANGIOGRAPHY N/A 05/24/2022   Procedure: LEFT HEART CATH AND CORONARY ANGIOGRAPHY;  Surgeon: Orbie Pyo, MD;  Location: [facility] INVASIVE CV LAB;  Service: Cardiovascular;  Laterality: N/A;   LEFT HEART CATH AND CORONARY ANGIOGRAPHY N/A 05/27/2022   Procedure: LEFT HEART CATH AND CORONARY ANGIOGRAPHY;  Surgeon: Corky Crafts, MD;  Location: [facility] INVASIVE CV LAB;  Service: Cardiovascular;   Laterality: N/A;   NASAL SEPTUM SURGERY      Allergies  Allergies  Allergen Reactions   Brilinta [Ticagrelor] Hives   Shellfish Allergy     hives    History of Present Illness    Kristy Heath is a 62 y.o. female with a PMH as mentioned above.  Had recent STEMI in January 2024.  Received PCI/DES to RCA x 2, PCI/DES to LAD x 1.  Cath report noted below.  Echocardiogram revealed normal EF, grade 1 DD, no significant valvular abnormalities.  Recommended for DAPT with aspirin and Brilinta for at least 1 year.  It was recommended that due to her insurance, she would need to switch to Plavix or Effient after her initial 30 days of Brilinta.  Developed a hematoma after cardiac catheterization that was stable at discharge. Followed by [facility] in the past for question of left subclavian artery stenosis.    Last seen by Eligha Bridegroom, NP on June 07, 2022.  She was doing well at that time.  Continue to note mild right upper extremity pain due to hematoma.  Was noted to have an allergic reaction to Brilinta, was changed to Plavix.  Denied any chest pain or shortness of breath.  Noted a slight flutter in chest the day of the appointment due to activity.  Was advised to order carotid duplex to him monitor, patient deferred study at this time.  Today she presents for 92-month follow-up.  She states she is doing well.  Denies any recent issues with her health. Denies any chest pain, shortness  of breath, palpitations, syncope, presyncope, dizziness, orthopnea, PND, swelling or significant weight changes, acute bleeding, or claudication.  Wanting to know if carotid Doppler should be repeated at this time.  SH: In her free time she enjoys gardening and thrifting. EKGs/Labs/Other Studies Reviewed:   The following studies were reviewed today:   EKG:  EKG is not ordered today.    Coronary stent intervention May 27, 2022:   Mid LAD lesion is 85% stenosed.  A drug-eluting stent was successfully placed  using a SYNERGY XD 2.50X28, postdilated to 2.75 mm and optimized with intravascular ultrasound.   Post intervention, there is a 0% residual stenosis.   1st Diag lesion is 50% stenosed.   Non-stenotic Prox RCA lesion was previously treated.   LV end diastolic pressure is normal.   There is no aortic valve stenosis.   Continue dual antiplatelet therapy for at least 1 year.  Continue aggressive secondary prevention.    Hopeful for discharge later today after bedrest assuming no groin issues.     Echo January 2024: 1. Poor acoustic windows   2. Left ventricular ejection fraction, by estimation, is 55 to 60%. The  left ventricle has normal function. Left ventricular endocardial border  not optimally defined to evaluate regional wall motion. Left ventricular  diastolic parameters are consistent  with Grade I diastolic dysfunction (impaired relaxation).   3. Right ventricular systolic function was not well visualized. The right  ventricular size is not well visualized.   4. The mitral valve is abnormal. No evidence of mitral valve  regurgitation. No evidence of mitral stenosis.   5. The aortic valve was not well visualized. There is moderate  calcification of the aortic valve. Aortic valve regurgitation is not  visualized. No aortic stenosis is present.   6. The inferior vena cava is normal in size with greater than 50%  respiratory variability, suggesting right atrial pressure of 3 mmHg.   Comparison(s): No prior Echocardiogram.   Left heart cath January 2024:   Prox RCA lesion is 100% stenosed.   Mid LAD lesion is 85% stenosed.   A stent was successfully placed.   Post intervention, there is a 0% residual stenosis.   1.  100% occluded proximal right coronary artery treated with 2 overlapping drug-eluting stents; due to distal embolization to the distal aspect of the RPLV system the patient will be treated with Aggrastat for 18 hours along with pain control. 2.  Residual high-grade  mid LAD lesion will be staged in the coming days. 3.  LVEDP of 12 mmHg.   Recommendation: Aspirin, Brilinta, and hold beta-blockade until norepinephrine weaned off.  Continue Aggrastat for 18 hours.  Staged PCI of mid LAD on Monday.  Recent Labs: 06/07/2022: BUN 20; Creatinine, Ser 0.98; Potassium 4.4; Sodium 140 06/24/2022: Hemoglobin 12.4; Platelets 292  Recent Lipid Panel    Component Value Date/Time   CHOL 147 05/25/2022 0615   TRIG 307 (H) 05/25/2022 0615   HDL 30 (L) 05/25/2022 0615   CHOLHDL 4.9 05/25/2022 0615   VLDL 61 (H) 05/25/2022 0615   LDLCALC 56 05/25/2022 0615   Home Medications   Current Meds  Medication Sig   aspirin 81 MG chewable tablet Chew 1 tablet (81 mg total) by mouth daily.   atorvastatin (LIPITOR) 80 MG tablet Take 1 tablet (80 mg total) by mouth daily.   cetirizine (ZYRTEC) 10 MG chewable tablet Chew 10 mg by mouth daily.   clopidogrel (PLAVIX) 75 MG tablet Take 1 tablet (75 mg total)  by mouth daily. Take (4)tabs - 300 mg 1st day then (1) tablet 75 mg daily thereafter   conjugated estrogens (PREMARIN) vaginal cream Place 1 Applicatorful vaginally daily as needed (discomfort).   Flaxseed, Linseed, (FLAX SEED OIL PO) Take by mouth.   hydrocortisone (ANUSOL-HC) 2.5 % rectal cream Place 1 Application rectally 2 (two) times daily as needed for hemorrhoids.   levothyroxine (SYNTHROID, LEVOTHROID) 50 MCG tablet Take 50 mcg by mouth daily before breakfast.   metoprolol tartrate (LOPRESSOR) 25 MG tablet Take 0.5 tablets (12.5 mg total) by mouth 2 (two) times daily.   nitroGLYCERIN (NITROSTAT) 0.4 MG SL tablet Place 1 tablet (0.4 mg total) under the tongue every 5 (five) minutes x 3 doses as needed for chest pain.   omeprazole (PRILOSEC) 20 MG capsule Take 20 mg by mouth daily.     Review of Systems    All other systems reviewed and are otherwise negative except as noted above.  Physical Exam    VS:  BP 130/78 (BP Location: Left Arm, Patient Position: Sitting,  Cuff Size: Normal)   Pulse 77   Ht 5' (1.524 m)   Wt 142 lb (64.4 kg)   SpO2 97%   BMI 27.73 kg/m  , BMI Body mass index is 27.73 kg/m.  Wt Readings from Last 3 Encounters:  09/03/22 142 lb (64.4 kg)  06/07/22 139 lb 6.4 oz (63.2 kg)  05/24/22 132 lb 4.4 oz (60 kg)     GEN: Well nourished, well developed, in no acute distress. HEENT: normal. Neck: Supple, no JVD, carotid bruits, or masses. Cardiac: S1/S2, RRR, no murmurs, rubs, or gallops. No clubbing, cyanosis, edema.  Radials/PT 2+ and equal bilaterally.  Respiratory:  Respirations regular and unlabored, clear to auscultation bilaterally. MS: No deformity or atrophy. Skin: Warm and dry, no rash. Neuro:  Strength and sensation are intact. Psych: Normal affect.  Assessment & Plan    CAD, s/p STEMI in 2024 S/P PCI/DES to RCA x 2, PCI/DES to LAD x 1. Deneis any chest pain.  Continue current medication regimen. Heart healthy diet and regular cardiovascular exercise encouraged.   2. HLD Past LDL noted to be 56. Defers future labs to PCP.  Continue atorvastatin. Heart healthy diet and regular cardiovascular exercise encouraged.   3. Bilateral carotid artery stenosis, PAD  Carotid Doppler in 2020 revealed right ICA stenosis at 40 to 59%, nonhemodynamically significant plaque less than 50% noted in the CCA.  Left ICA stenosis at 1 to 39%.  Followed by [facility] in the past for question of left subclavian artery stenosis.  Denies any symptoms.  Will arrange carotid doppler per her request.  4. HTN Blood pressure stable. Discussed to monitor BP at home at least 2 hours after medications and sitting for 5-10 minutes.  Continue current medication regimen. Heart healthy diet and regular cardiovascular exercise encouraged.   Disposition: Follow up in 6 month(s) with Orbie Pyo, MD or [facility].  Signed, Sharlene Dory, NP 09/03/2022, 12:20 PM [facility]

## 2022-09-26 ENCOUNTER — Ambulatory Visit: Payer: 59 | Attending: Nurse Practitioner

## 2022-09-26 DIAGNOSIS — I6523 Occlusion and stenosis of bilateral carotid arteries: Secondary | ICD-10-CM

## 2022-11-17 ENCOUNTER — Other Ambulatory Visit: Payer: Self-pay | Admitting: Cardiology

## 2023-02-20 ENCOUNTER — Encounter: Payer: Self-pay | Admitting: Internal Medicine

## 2023-02-20 ENCOUNTER — Ambulatory Visit: Payer: 59 | Attending: Internal Medicine | Admitting: Internal Medicine

## 2023-02-20 VITALS — BP 132/80 | HR 82 | Ht 60.0 in | Wt 141.0 lb

## 2023-02-20 DIAGNOSIS — I2510 Atherosclerotic heart disease of native coronary artery without angina pectoris: Secondary | ICD-10-CM

## 2023-02-20 DIAGNOSIS — Z7902 Long term (current) use of antithrombotics/antiplatelets: Secondary | ICD-10-CM | POA: Diagnosis not present

## 2023-02-20 MED ORDER — LOSARTAN POTASSIUM 25 MG PO TABS: 12.5000 mg | ORAL_TABLET | Freq: Every day | ORAL | 3 refills | Status: DC

## 2023-02-20 MED ORDER — CLOPIDOGREL BISULFATE 75 MG PO TABS: 75.0000 mg | ORAL_TABLET | Freq: Every day | ORAL | Status: DC

## 2023-02-20 NOTE — Patient Instructions (Addendum)
Medication Instructions:  Your physician has recommended you make the following change in your medication:  Start taking Losartan 12.5 mg once daily Continue taking Plavix until 06/20/2023 Continue taking all other medications as prescribed  Labwork: None  Testing/Procedures: None  Follow-Up: Your physician recommends that you schedule a follow-up appointment in: 6 months  Any Other Special Instructions Will Be Listed Below (If Applicable). Thank you for choosing [facility]!     If you need a refill on your cardiac medications before your next appointment, please call your pharmacy.

## 2023-02-20 NOTE — Progress Notes (Signed)
Cardiology Office Note  Date: 02/20/2023   ID: Kristy Heath, DOB February 22, 1961, MRN [id number]  PCP:  Lianne Moris, PA-C  Cardiologist:  Orbie Pyo, MD Electrophysiologist:  None   History of Present Illness: Kristy Heath is a 62 y.o. female  Overall doing great, no angina.  She does have pain in the right side of her breast reproducible on palpation.  She said this might have occurred after she leaned over the table to solve the puzzle at home.  She also was having indigestion symptoms which was not similar to the index dyspepsia when she had STEMI.  No true angina.  No DOE, orthopnea, PND.  Continues to smoke cigarettes.  Compliant with medications.  No dizziness except positional.  No presyncope or syncope.  No leg swelling.  No bleeding complications.  Past Medical History:  Diagnosis Date   Carotid artery disease (HCC)    Hypertension    Mixed hyperlipidemia     Past Surgical History:  Procedure Laterality Date   CESAREAN SECTION     x 2   COLONOSCOPY N/A 04/06/2015   Procedure: COLONOSCOPY;  Surgeon: Malissa Hippo, MD;  [facility];  Service: Endoscopy;  Laterality: N/A;  1:00   CORONARY STENT INTERVENTION N/A 05/27/2022   Procedure: CORONARY STENT INTERVENTION;  Surgeon: Corky Crafts, MD;  Location: [facility] INVASIVE CV LAB;  Service: Cardiovascular;  Laterality: N/A;   CORONARY ULTRASOUND/IVUS N/A 05/27/2022   Procedure: Intravascular Ultrasound/IVUS;  Surgeon: Corky Crafts, MD;  Location: [facility] INVASIVE CV LAB;  Service: Cardiovascular;  Laterality: N/A;   CORONARY/GRAFT ACUTE MI REVASCULARIZATION N/A 05/24/2022   Procedure: Coronary/Graft Acute MI Revascularization;  Surgeon: Orbie Pyo, MD;  Location: [facility] INVASIVE CV LAB;  Service: Cardiovascular;  Laterality: N/A;   LEFT HEART CATH AND CORONARY ANGIOGRAPHY N/A 05/24/2022   Procedure: LEFT HEART CATH AND CORONARY ANGIOGRAPHY;  Surgeon: Orbie Pyo, MD;  Location: [facility] INVASIVE CV LAB;   Service: Cardiovascular;  Laterality: N/A;   LEFT HEART CATH AND CORONARY ANGIOGRAPHY N/A 05/27/2022   Procedure: LEFT HEART CATH AND CORONARY ANGIOGRAPHY;  Surgeon: Corky Crafts, MD;  Location: [facility] INVASIVE CV LAB;  Service: Cardiovascular;  Laterality: N/A;   NASAL SEPTUM SURGERY      Current Outpatient Medications  Medication Sig Dispense Refill   aspirin 81 MG chewable tablet Chew 1 tablet (81 mg total) by mouth daily. 90 tablet 2   atorvastatin (LIPITOR) 80 MG tablet TAKE 1 TABLET BY MOUTH EVERY DAY 90 tablet 3   cetirizine (ZYRTEC) 10 MG chewable tablet Chew 10 mg by mouth daily.     clopidogrel (PLAVIX) 75 MG tablet Take 1 tablet (75 mg total) by mouth daily. Take (4)tabs - 300 mg 1st day then (1) tablet 75 mg daily thereafter 94 tablet 3   conjugated estrogens (PREMARIN) vaginal cream Place 1 Applicatorful vaginally daily as needed (discomfort).     Flaxseed, Linseed, (FLAX SEED OIL PO) Take by mouth daily.     hydrocortisone (ANUSOL-HC) 2.5 % rectal cream Place 1 Application rectally 2 (two) times daily as needed for hemorrhoids.     levothyroxine (SYNTHROID, LEVOTHROID) 50 MCG tablet Take 50 mcg by mouth daily before breakfast.     metoprolol tartrate (LOPRESSOR) 25 MG tablet Take 0.5 tablets (12.5 mg total) by mouth 2 (two) times daily. 90 tablet 3   Multiple Vitamins-Minerals (ONE A DAY WOMEN 50 PLUS PO) Take by mouth daily.     nitroGLYCERIN (NITROSTAT) 0.4 MG  SL tablet Place 1 tablet (0.4 mg total) under the tongue every 5 (five) minutes x 3 doses as needed for chest pain. 25 tablet 2   Nutritional Supplements (KETO PO) Take by mouth 3 (three) times daily. Keto Gummies     omeprazole (PRILOSEC) 20 MG capsule Take 20 mg by mouth daily.     No current facility-administered medications for this visit.   Allergies:  Brilinta [ticagrelor] and Shellfish allergy   Social History: The patient  reports that she has quit smoking. Her smoking use included cigarettes. She started  smoking about 46 years ago. She has a 23.3 pack-year smoking history. She has never been exposed to tobacco smoke. She has never used smokeless tobacco. She reports that she does not drink alcohol and does not use drugs.   Family History: The patient's family history includes Heart disease in her father; Hypertension in her brother.   ROS:  Please see the history of present illness. Otherwise, complete review of systems is positive for none  All other systems are reviewed and negative.   Physical Exam: VS:  BP 132/80   Pulse 82   Ht 5' (1.524 m)   Wt 141 lb (64 kg)   SpO2 98%   BMI 27.54 kg/m , BMI Body mass index is 27.54 kg/m.  Wt Readings from Last 3 Encounters:  02/20/23 141 lb (64 kg)  09/03/22 142 lb (64.4 kg)  06/07/22 139 lb 6.4 oz (63.2 kg)    General: Patient appears comfortable at rest. HEENT: Conjunctiva and lids normal, oropharynx clear with moist mucosa. Neck: Supple, no elevated JVP or carotid bruits, no thyromegaly. Lungs: Clear to auscultation, nonlabored breathing at rest. Cardiac: Regular rate and rhythm, no S3 or significant systolic murmur, no pericardial rub. Abdomen: Soft, nontender, no hepatomegaly, bowel sounds present, no guarding or rebound. Extremities: No pitting edema, distal pulses 2+. Skin: Warm and dry. Musculoskeletal: No kyphosis. Neuropsychiatric: Alert and oriented x3, affect grossly appropriate.  Recent Labwork: 06/07/2022: BUN 20; Creatinine, Ser 0.98; Potassium 4.4; Sodium 140 06/24/2022: Hemoglobin 12.4; Platelets 292     Component Value Date/Time   CHOL 147 05/25/2022 0615   TRIG 307 (H) 05/25/2022 0615   HDL 30 (L) 05/25/2022 0615   CHOLHDL 4.9 05/25/2022 0615   VLDL 61 (H) 05/25/2022 0615   LDLCALC 56 05/25/2022 0615    Assessment and Plan:  CAD manifested by inferior STEMI in 05/2022 s/p RCA PCI c/w distal embolization of the R PLV, staged mid LAD PCI with normal LVEF, angina free Mild carotid artery stenosis HLD, at  goal Nicotine abuse  -Continue DAPT, aspirin 81 and Plavix 75 for total duration of 1 year. Stop Plavix on 06/20/2023.  Continue atorvastatin 80 mg nightly. -Continue metoprolol tartrate 12.5 mg twice daily and start losartan 12.5 mg once daily.  Will continue BB and ARB for total duration of 3 years after STEMI. -Smoking cessation counseling provided. -ER cautions with chest pain.     Medication Adjustments/Labs and Tests Ordered: Current medicines are reviewed at length with the patient today.  Concerns regarding medicines are outlined above.    Disposition:  Follow up  6 months  Signed Merle Whitehorn Verne Spurr, MD, 02/20/2023 10:32 AM    [facility] at [facility] [address] [place], [place], [place] [address]

## 2023-03-04 DIAGNOSIS — Z6827 Body mass index (BMI) 27.0-27.9, adult: Secondary | ICD-10-CM | POA: Diagnosis not present

## 2023-03-04 DIAGNOSIS — I252 Old myocardial infarction: Secondary | ICD-10-CM | POA: Diagnosis not present

## 2023-03-04 DIAGNOSIS — K219 Gastro-esophageal reflux disease without esophagitis: Secondary | ICD-10-CM | POA: Diagnosis not present

## 2023-03-04 DIAGNOSIS — I2510 Atherosclerotic heart disease of native coronary artery without angina pectoris: Secondary | ICD-10-CM | POA: Diagnosis not present

## 2023-03-04 DIAGNOSIS — Z1331 Encounter for screening for depression: Secondary | ICD-10-CM | POA: Diagnosis not present

## 2023-03-04 DIAGNOSIS — R030 Elevated blood-pressure reading, without diagnosis of hypertension: Secondary | ICD-10-CM | POA: Diagnosis not present

## 2023-03-04 DIAGNOSIS — Z1389 Encounter for screening for other disorder: Secondary | ICD-10-CM | POA: Diagnosis not present

## 2023-03-04 DIAGNOSIS — Z23 Encounter for immunization: Secondary | ICD-10-CM | POA: Diagnosis not present

## 2023-04-27 ENCOUNTER — Other Ambulatory Visit: Payer: Self-pay | Admitting: Internal Medicine

## 2023-05-06 ENCOUNTER — Other Ambulatory Visit: Payer: Self-pay | Admitting: Family Medicine

## 2023-05-06 ENCOUNTER — Other Ambulatory Visit (HOSPITAL_COMMUNITY): Payer: Self-pay | Admitting: Family Medicine

## 2023-05-06 ENCOUNTER — Encounter: Payer: Self-pay | Admitting: Family Medicine

## 2023-05-06 DIAGNOSIS — Z1231 Encounter for screening mammogram for malignant neoplasm of breast: Secondary | ICD-10-CM

## 2023-05-06 DIAGNOSIS — F17210 Nicotine dependence, cigarettes, uncomplicated: Secondary | ICD-10-CM

## 2023-05-09 ENCOUNTER — Telehealth: Payer: Self-pay

## 2023-05-09 MED ORDER — EZETIMIBE 10 MG PO TABS: 10.0000 mg | ORAL_TABLET | Freq: Every day | ORAL | 1 refills | Status: DC

## 2023-05-09 NOTE — Telephone Encounter (Signed)
-----   Message from Vishnu P Mallipeddi sent at 05/07/2023  9:03 AM EST ----- LDL not at goal. LDL goal should be less than 70. Start Zetia 10 mg once daily in addition to current statin dose.

## 2023-05-09 NOTE — Telephone Encounter (Signed)
Patient informed and verbalized understanding of plan. 

## 2023-06-19 ENCOUNTER — Telehealth: Payer: Self-pay | Admitting: Internal Medicine

## 2023-06-19 NOTE — Telephone Encounter (Signed)
Continue DAPT, aspirin 81 and Plavix 75 for total duration of 1 year. Stop Plavix on 06/20/2023.    Notified patient per above note - Dr. Jenene Slicker last OV note 02/20/2023.

## 2023-06-19 NOTE — Telephone Encounter (Signed)
Pt c/o medication issue:  1. Name of Medication:   clopidogrel (PLAVIX) 75 MG tablet    2. How are you currently taking this medication (dosage and times per day)?   3. Are you having a reaction (difficulty breathing--STAT)?   4. What is your medication issue? Patient is requesting call back to get clarification on this medication. Please advise.

## 2023-07-08 ENCOUNTER — Other Ambulatory Visit: Payer: Self-pay | Admitting: Internal Medicine

## 2023-08-06 DIAGNOSIS — Z1231 Encounter for screening mammogram for malignant neoplasm of breast: Secondary | ICD-10-CM | POA: Diagnosis not present

## 2023-08-21 ENCOUNTER — Ambulatory Visit: Payer: 59 | Admitting: Internal Medicine

## 2023-08-25 ENCOUNTER — Encounter: Payer: Self-pay | Admitting: Internal Medicine

## 2023-08-25 ENCOUNTER — Ambulatory Visit: Payer: 59 | Attending: Internal Medicine | Admitting: Internal Medicine

## 2023-08-25 VITALS — BP 118/72 | HR 83 | Ht 60.0 in | Wt 143.6 lb

## 2023-08-25 DIAGNOSIS — I10 Essential (primary) hypertension: Secondary | ICD-10-CM | POA: Diagnosis not present

## 2023-08-25 DIAGNOSIS — Z720 Tobacco use: Secondary | ICD-10-CM

## 2023-08-25 DIAGNOSIS — I2510 Atherosclerotic heart disease of native coronary artery without angina pectoris: Secondary | ICD-10-CM

## 2023-08-25 NOTE — Progress Notes (Signed)
 Cardiology Office Note  Date: 08/25/2023   ID: Kristy Heath, DOB Oct 06, 1960, MRN [id number]  PCP:  Lianne Moris, PA-C  Cardiologist:  Orbie Pyo, MD Electrophysiologist:  None   History of Present Illness: Kristy Heath is a 63 y.o. female  No symptoms, overall doing great.  No angina, DOE, dizziness, syncope, leg swelling, palpitations.  Compliant with medications.  Smokes half a pack a day, few times per week.  Trying hard to quit.  Past Medical History:  Diagnosis Date   Carotid artery disease (HCC)    Hypertension    Mixed hyperlipidemia     Past Surgical History:  Procedure Laterality Date   CESAREAN SECTION     x 2   COLONOSCOPY N/A 04/06/2015   Procedure: COLONOSCOPY;  Surgeon: Malissa Hippo, MD;  Location: [facility];  Service: Endoscopy;  Laterality: N/A;  1:00   CORONARY STENT INTERVENTION N/A 05/27/2022   Procedure: CORONARY STENT INTERVENTION;  Surgeon: Corky Crafts, MD;  Location: [facility] INVASIVE CV LAB;  Service: Cardiovascular;  Laterality: N/A;   CORONARY ULTRASOUND/IVUS N/A 05/27/2022   Procedure: Intravascular Ultrasound/IVUS;  Surgeon: Corky Crafts, MD;  Location: [facility] INVASIVE CV LAB;  Service: Cardiovascular;  Laterality: N/A;   CORONARY/GRAFT ACUTE MI REVASCULARIZATION N/A 05/24/2022   Procedure: Coronary/Graft Acute MI Revascularization;  Surgeon: Orbie Pyo, MD;  Location: [facility] INVASIVE CV LAB;  Service: Cardiovascular;  Laterality: N/A;   LEFT HEART CATH AND CORONARY ANGIOGRAPHY N/A 05/24/2022   Procedure: LEFT HEART CATH AND CORONARY ANGIOGRAPHY;  Surgeon: Orbie Pyo, MD;  Location: [facility] INVASIVE CV LAB;  Service: Cardiovascular;  Laterality: N/A;   LEFT HEART CATH AND CORONARY ANGIOGRAPHY N/A 05/27/2022   Procedure: LEFT HEART CATH AND CORONARY ANGIOGRAPHY;  Surgeon: Corky Crafts, MD;  Location: [facility] INVASIVE CV LAB;  Service: Cardiovascular;  Laterality: N/A;   NASAL SEPTUM SURGERY      Current Outpatient Medications   Medication Sig Dispense Refill   aspirin 81 MG chewable tablet Chew 1 tablet (81 mg total) by mouth daily. 90 tablet 2   atorvastatin (LIPITOR) 80 MG tablet TAKE 1 TABLET BY MOUTH EVERY DAY 90 tablet 3   cetirizine (ZYRTEC) 10 MG chewable tablet Chew 10 mg by mouth daily.     conjugated estrogens (PREMARIN) vaginal cream Place 1 Applicatorful vaginally daily as needed (discomfort).     ezetimibe (ZETIA) 10 MG tablet Take 1 tablet (10 mg total) by mouth daily. 90 tablet 1   Flaxseed, Linseed, (FLAX SEED OIL PO) Take by mouth daily.     hydrocortisone (ANUSOL-HC) 2.5 % rectal cream Place 1 Application rectally 2 (two) times daily as needed for hemorrhoids.     levothyroxine (SYNTHROID, LEVOTHROID) 50 MCG tablet Take 50 mcg by mouth daily before breakfast.     losartan (COZAAR) 25 MG tablet Take 0.5 tablets (12.5 mg total) by mouth daily. 45 tablet 3   metoprolol tartrate (LOPRESSOR) 25 MG tablet TAKE 0.5 TABLETS BY MOUTH 2 TIMES DAILY. 90 tablet 0   Multiple Vitamins-Minerals (ONE A DAY WOMEN 50 PLUS PO) Take by mouth daily.     nitroGLYCERIN (NITROSTAT) 0.4 MG SL tablet Place 1 tablet (0.4 mg total) under the tongue every 5 (five) minutes x 3 doses as needed for chest pain. 25 tablet 2   Nutritional Supplements (KETO PO) Take by mouth 3 (three) times daily. Keto Gummies     omeprazole (PRILOSEC) 20 MG capsule Take 20 mg by mouth daily.  No current facility-administered medications for this visit.   Allergies:  Brilinta [ticagrelor] and Shellfish allergy   Social History: The patient  reports that she has been smoking cigarettes. She started smoking about 47 years ago. She has a 23.5 pack-year smoking history. She has never been exposed to tobacco smoke. She has never used smokeless tobacco. She reports that she does not drink alcohol and does not use drugs.   Family History: The patient's family history includes Heart disease in her father; Hypertension in her brother.   ROS:  Please see  the history of present illness. Otherwise, complete review of systems is positive for none  All other systems are reviewed and negative.   Physical Exam: VS:  BP 118/72   Pulse 83   Ht 5' (1.524 m)   Wt 143 lb 9.6 oz (65.1 kg)   SpO2 98%   BMI 28.04 kg/m , BMI Body mass index is 28.04 kg/m.  Wt Readings from Last 3 Encounters:  08/25/23 143 lb 9.6 oz (65.1 kg)  02/20/23 141 lb (64 kg)  09/03/22 142 lb (64.4 kg)    General: Patient appears comfortable at rest. HEENT: Conjunctiva and lids normal, oropharynx clear with moist mucosa. Neck: Supple, no elevated JVP or carotid bruits, no thyromegaly. Lungs: Clear to auscultation, nonlabored breathing at rest. Cardiac: Regular rate and rhythm, no S3 or significant systolic murmur, no pericardial rub. Abdomen: Soft, nontender, no hepatomegaly, bowel sounds present, no guarding or rebound. Extremities: No pitting edema, distal pulses 2+. Skin: Warm and dry. Musculoskeletal: No kyphosis. Neuropsychiatric: Alert and oriented x3, affect grossly appropriate.  Recent Labwork: No results found for requested labs within last 365 days.     Component Value Date/Time   CHOL 147 05/25/2022 0615   TRIG 307 (H) 05/25/2022 0615   HDL 30 (L) 05/25/2022 0615   CHOLHDL 4.9 05/25/2022 0615   VLDL 61 (H) 05/25/2022 0615   LDLCALC 56 05/25/2022 0615    Assessment and Plan:  CAD manifested by inferior STEMI in 05/2022 s/p RCA PCI c/w distal embolization of the R PLV, staged mid LAD PCI with normal LVEF, angina free Mild carotid artery stenosis HLD, at goal Nicotine abuse  -Overall doing great, no angina or DOE.  Continue aspirin 81 mg once daily, atorvastatin 80 mg nightly.  Goal LDL less than 55.  Currently at goal.  Continue metoprolol tartrate 12.5 mg twice daily and losartan 12.5 mg once daily for total duration of 3 years after STEMI.  Smoking cessation counseling provided, currently smokes half a pack a day for few times per week.  Referral to  smoking cessation program placed.  She will speak with her PCP about Chantix.     Medication Adjustments/Labs and Tests Ordered: Current medicines are reviewed at length with the patient today.  Concerns regarding medicines are outlined above.    Disposition:  Follow up 1 year  Signed Zebastian Carico Verne Spurr, MD, 08/25/2023 12:04 PM    [facility] [address] [place], [place], [place] [address]

## 2023-08-25 NOTE — Patient Instructions (Signed)

## 2023-10-02 ENCOUNTER — Encounter: Payer: Self-pay | Admitting: Physician Assistant

## 2023-10-02 ENCOUNTER — Telehealth: Admitting: Physician Assistant

## 2023-10-02 DIAGNOSIS — Z716 Tobacco abuse counseling: Secondary | ICD-10-CM

## 2023-10-02 DIAGNOSIS — F17210 Nicotine dependence, cigarettes, uncomplicated: Secondary | ICD-10-CM

## 2023-10-02 MED ORDER — BUPROPION HCL ER (SR) 150 MG PO TB12: ORAL_TABLET | ORAL | 0 refills | Status: DC

## 2023-10-02 NOTE — Progress Notes (Signed)
 Virtual Visit Consent  Kristy Heath, you are scheduled for a virtual visit with a [facility] provider today. Just as with appointments in the office, your consent must be obtained to participate. Your consent will be active for this visit and any virtual visit you may have with one of our providers in the next 365 days. If you have a MyChart account, a copy of this consent can be sent to you electronically.  As this is a virtual visit, video technology does not allow for your provider to perform a traditional examination. This may limit your provider's ability to fully assess your condition. If your provider identifies any concerns that need to be evaluated in person or the need to arrange testing (such as labs, EKG, etc.), we will make arrangements to do so. Although advances in technology are sophisticated, we cannot ensure that it will always work on either your end or our end. If the connection with a video visit is poor, the visit may have to be switched to a telephone visit. With either a video or telephone visit, we are not always able to ensure that we have a secure connection.  By engaging in this virtual visit, you consent to the provision of healthcare and authorize for your insurance to be billed (if applicable) for the services provided during this visit. Depending on your insurance coverage, you may receive a charge related to this service.  I need to obtain your verbal consent now. Are you willing to proceed with your visit today? Jasmina Insana has provided verbal consent on 10/02/2023 for a virtual visit (video or telephone). Hyla Maillard, [place]  Date: 10/02/2023 10:56 AM  Virtual Visit via Video Note  I, Hyla Maillard, connected with  Kristy Heath  ([id number], 1960/10/11) on 10/02/23 at 10:00 AM EDT by a video-enabled telemedicine application and verified that I am speaking with the correct person using two identifiers.  Location: Patient: Virtual Visit Location  Patient: Home Provider: Virtual Visit Location Provider: Home Office   I discussed the limitations of evaluation and management by telemedicine and the availability of in person appointments. The patient expressed understanding and agreed to proceed.    History of Present Illness: Kristy Heath is wanting to discuss their tobacco use and is seeking assistance with cessation. Patient has been smoking/using around 1/3 pack cigarettes per day for the past 47 years. There has been prior attempts to quit with success. Endorses quit attempt 10 years ago with Chantix which was successful for about 6 months. Notes she had a very hard time on the Chantix due to substantial GI distress. Relapsed after a very stressful family situation.   Denies alcohol  use.  Former use of marijuana recreationally. None at present.   Notes her current motivation for cessation is for the health benefits, mainly as it pertains to being able to be more active and have more years with her grandchildren.   1st cigarette use is > 30 minutes after waking in the morning.  No diagnosed history of COPD or asthma. Denies issues with cough or SOB.   Does have history of coronary artery disease, STEMI, left subclavian artery stenosis, HLD. Is followed by [facility] in [place]. No documented hx of HTN.   BP Readings from Last 3 Encounters:  08/25/23 118/72  02/20/23 132/80  09/03/22 130/78   Patient also with history of acid reflux, currently managed with omeprazole daily.  Denies breakthrough symptoms.   Triggers: The following trigger(s) for use has/have been identified:  psychological: social, car, high stress situations  Withdrawal Symptoms: Identified withdrawal symptoms: irritable mood.  State of Readiness: Patient feels overall Ready to quit. Concerns/Barriers to Cessation - Weight Gain  Observations/Objective: Patient is well-developed, well-nourished in no acute distress.  Resting comfortably at home.  Head is  normocephalic, atraumatic.  No labored breathing. Speech is clear and coherent with logical content.  Patient is alert and oriented at baseline.   Assessment and Plan: There are no diagnoses linked to this encounter.  - Patient is currently at the following State of Change: Preparation -- actively planning an attempt to quit - Comorbid conditions identified: CAD with HLD and hx STEMI -- BP stable. Maintained on statin, ASA, BB and ARB.    Abstracted from EMR : Left heart catheterization 05/24/22 revealed 100% occluded proximal RCA occlusion treated with PCI/DES x 2 with distal embolization of the RPLV and placed on Aggrastat  for 18 hours. Had mid LAD lesion 85% stenosis. She underwent mid LAD lesion with staged intervention on 1/8 with DES x 1.  Documentation for DAPT with aspirin /Brilinta  for at least 1 year.  Follow-up echocardiogram showed LVEF 55 to 60%, grade 1 diastolic dysfunction, no significant valvular disease.    - Reviewed impacts of smoking on patient's health. - Patient is due for routine low-dose CT for lung cancer screening.  Has not had in few years.  She is going to reach out to her primary care provider regarding this, but will send copy of my note to both PCP and cardiologist to see if they can help set this up, since currently we cannot place referrals. - Quit date set for May 30th. - I have prescribed Wellbutrin (Bupropion SR) 150 mg tablets. The patient is to start these one week before their selected quit date. This can be taken with or without food. To start, will have patient take 1 tablet by mouth daily for 3 days. Then increase to 1 tablet twice daily. We will plan to continue this for a total of 12 weeks after reassessing how the patient is doing at follow-up. - Other resources including the [employer] and Department of Health and [employer] -- have been included in the patient's written instructions and sent to their MyChart. - Plan for follow-up in 4  weeks - appointment scheduled.   Follow Up Instructions: I discussed the assessment and treatment plan with the patient. The patient was provided an opportunity to ask questions and all were answered. The patient agreed with the plan and demonstrated an understanding of the instructions.  A copy of instructions were sent to the patient via MyChart unless otherwise noted below.   Time:  I have spent 30 minutes with the patient via telehealth technology in tobacco cessation counseling.    Hyla Maillard, PA-C

## 2023-10-02 NOTE — Patient Instructions (Signed)
 Kristy Heath, thank you for joining Hyla Maillard, PA-C for today's virtual visit.  While this provider is not your primary care provider (PCP), if your PCP is located in our provider database this encounter information will be shared with them immediately following your visit.  Consent: (Patient) Kristy Heath provided verbal consent for this virtual visit at the beginning of the encounter.  Current Medications:  Current Outpatient Medications:  .  buPROPion (WELLBUTRIN SR) 150 MG 12 hr tablet, Take 1 tab PO daily x 3 days. Then increase to 1 tab BID., Disp: 60 tablet, Rfl: 0 .  aspirin  81 MG chewable tablet, Chew 1 tablet (81 mg total) by mouth daily., Disp: 90 tablet, Rfl: 2 .  atorvastatin  (LIPITOR ) 80 MG tablet, TAKE 1 TABLET BY MOUTH EVERY DAY, Disp: 90 tablet, Rfl: 3 .  cetirizine (ZYRTEC) 10 MG chewable tablet, Chew 10 mg by mouth daily., Disp: , Rfl:  .  conjugated estrogens (PREMARIN) vaginal cream, Place 1 Applicatorful vaginally daily as needed (discomfort)., Disp: , Rfl:  .  ezetimibe  (ZETIA ) 10 MG tablet, Take 1 tablet (10 mg total) by mouth daily., Disp: 90 tablet, Rfl: 1 .  Flaxseed, Linseed, (FLAX SEED OIL PO), Take by mouth daily., Disp: , Rfl:  .  hydrocortisone (ANUSOL-HC) 2.5 % rectal cream, Place 1 Application rectally 2 (two) times daily as needed for hemorrhoids., Disp: , Rfl:  .  levothyroxine  (SYNTHROID , LEVOTHROID) 50 MCG tablet, Take 50 mcg by mouth daily before breakfast., Disp: , Rfl:  .  losartan  (COZAAR ) 25 MG tablet, Take 0.5 tablets (12.5 mg total) by mouth daily., Disp: 45 tablet, Rfl: 3 .  metoprolol  tartrate (LOPRESSOR ) 25 MG tablet, TAKE 0.5 TABLETS BY MOUTH 2 TIMES DAILY., Disp: 90 tablet, Rfl: 0 .  Multiple Vitamins-Minerals (ONE A DAY WOMEN 50 PLUS PO), Take by mouth daily., Disp: , Rfl:  .  nitroGLYCERIN  (NITROSTAT ) 0.4 MG SL tablet, Place 1 tablet (0.4 mg total) under the tongue every 5 (five) minutes x 3 doses as needed for chest pain., Disp: 25  tablet, Rfl: 2 .  omeprazole (PRILOSEC) 20 MG capsule, Take 20 mg by mouth daily., Disp: , Rfl:    When you are getting low on your smoking cessation medications, schedule your next video appointment as previously discussed. This way we can follow-up with you, make any necessary adjustments/changes, and get next fill of medication sent in to your pharmacy.   Follow-Up: Call back or seek an in-person evaluation if the symptoms worsen or if the condition fails to improve as anticipated.  Other Instructions  Congratulations for your interest in quitting smoking!  Quitting smoking is one of the most important things you can do to protect your health.  We are here to help you! Did you know that if you quit smoking 1 pack per day you could save up to $2550.00 per year?  Medications are not appropriate for everyone depending upon your situation and any health issues you may have. If we do prescribe medication, it will be for 1 month at time and you will be required to have a follow up Video visit at 1 month to assess how you are doing and if there are any side effects.  This could be for up to a total of 3 months.    Support from your friends, family and work colleagues is very important. Please let them know that you are trying to stop smoking so that they understand your need for support in this goal!  -  Remove tobacco products from your environment - Set a quit date ideally within 2 weeks. - You should totally abstain from smoking after your quit date. A single puff could hurt your progress or cause you to relapse - If there are others in your household that smoke, ask them to try to quit or abstain from smoking in your presence  You may notice nicotine withdrawal symptoms such as increased appetite and weight gain, changes in mood, insomnia, irritability and/or anxiety. These symptoms peak in the first three days after smoking cessation and subside over the next 3-4 weeks.   I have prescribed  Wellbutrin (Bupropion SR) 150 mg tablets. The patient is to start these one week before their selected quit date. This can be taken with or without food. To start, will have patient take 1 tablet by mouth daily for 3 days. Then increase to 1 tablet twice daily. We will plan to continue this for a total of 12 weeks after reassessing how the patient is doing at follow-up.  A combination of behavioral and medication can improve the success of you quitting smoking. You may want to use the 1-800-QUIT-NOW free support line.  Also, the Department of Health and Human Services provides Smoke free apps for smartphones: SharedCustomer[employer]fi  If you happen to break your plan and have a cigarette, keep taking your medications and continue to try to abstain.  Do not give up!  MAKE SURE YOU  Take any prescribed medications only as instructed.  If you miss a dose of medication, take the next dose when it is due and get back on schedule. DO NOT double up on medications.  Mark your calendar to do your Follow Up Smoking Cessation Visit in one month   If you have been instructed to have an in-person evaluation today at a local Urgent Care facility, please use the link below. It will take you to a list of all of our available [facility], including address, phone number and hours of operation. Please do not delay care.  [facility]  If you or a family member do not have a primary care provider, use the link below to schedule a visit and establish care. When you choose a [facility] primary care physician or advanced practice provider, you gain a long-term partner in health. Find a Primary Care Provider  Learn more about [facility]'s in-office and virtual care options: [facility] - Get Care Now

## 2023-10-02 NOTE — Progress Notes (Deleted)
 Virtual Visit Consent  Kristy Heath, you are scheduled for a virtual visit with a [facility] provider today. Just as with appointments in the office, your consent must be obtained to participate. Your consent will be active for this visit and any virtual visit you may have with one of our providers in the next 365 days. If you have a MyChart account, a copy of this consent can be sent to you electronically.  As this is a virtual visit, video technology does not allow for your provider to perform a traditional examination. This may limit your provider's ability to fully assess your condition. If your provider identifies any concerns that need to be evaluated in person or the need to arrange testing (such as labs, EKG, etc.), we will make arrangements to do so. Although advances in technology are sophisticated, we cannot ensure that it will always work on either your end or our end. If the connection with a video visit is poor, the visit may have to be switched to a telephone visit. With either a video or telephone visit, we are not always able to ensure that we have a secure connection.  By engaging in this virtual visit, you consent to the provision of healthcare and authorize for your insurance to be billed (if applicable) for the services provided during this visit. Depending on your insurance coverage, you may receive a charge related to this service.  I need to obtain your verbal consent now. Are you willing to proceed with your visit today? Beckie Candito has provided verbal consent on 10/02/2023 for a virtual visit (video or telephone). Kristy Heath, [place]  Date: 10/02/2023 9:51 AM  Virtual Visit via Video Note  I, Kristy Heath, connected with  Kristy Heath  ([id number], [birth year]) on 10/02/23 at 10:00 AM EDT by a video-enabled telemedicine application and verified that I am speaking with the correct person using two identifiers.  Location: Patient: Virtual Visit Location Patient:  Home Provider: Virtual Visit Location Provider: Home Office   I discussed the limitations of evaluation and management by telemedicine and the availability of in person appointments. The patient expressed understanding and agreed to proceed.    History of Present Illness: [name] is wanting to discuss their tobacco use and is seeking assistance with cessation. Patient has been smoking/using around *** {CTHTOBPRODUCTS:21040018} per day for the past *** years. There {HAS:21040019} been prior attempts to quit {w/wo:21040020} success. ***.   Triggers: The following trigger(s) for use has/have been identified: psychological: {:311120}  Withdrawal Symptoms: Identified withdrawal symptoms: {hx withdrawal symptoms:311124}.  State of Readiness: Patient feels overall {CTHPATIENTREADINESS:21040022} to quit. Concerns/Barriers to Cessation - {CTHBARRIERS:21040023}  Observations/Objective: Patient is well-developed, well-nourished in no acute distress.  Resting comfortably *** at home.  Head is normocephalic, atraumatic.  No labored breathing. *** Speech is clear and coherent with logical content.  Patient is alert and oriented at baseline.  ***  Assessment and Plan: There are no diagnoses linked to this encounter.  - Patient is currently at the following State of Change: {CTHSTATEOFCHANGE:21040021} - Comorbid conditions identified: *** - Reviewed impacts of smoking on patient's health. - *** - Quit date set for ***. - {SMOKINGCESSATIONMEDSPLAN:21040024} - Other resources including the [employer] and Department of Health and [employer] -- have been included in the patient's written instructions and sent to their MyChart. - *** - Plan for follow-up in {CTHFOLLOWUP:21050025}  Follow Up Instructions: I discussed the assessment and treatment plan with the patient. The patient was provided  an opportunity to ask questions and all were answered. The patient agreed with the plan  and demonstrated an understanding of the instructions.  A copy of instructions were sent to the patient via MyChart unless otherwise noted below.   {EMAIL AVS:29686::"Patient has requested to receive PHI (AVS, Work Notes, etc) pertaining to this video visit through e-mail as they are currently without active MyChart. They have voiced understand that email is not considered secure and their health information could be viewed by someone other than the patient. "}  Time:  I have spent *** minutes with the patient via telehealth technology in tobacco cessation counseling.    Kristy Maillard, PA-C

## 2023-10-29 ENCOUNTER — Other Ambulatory Visit: Payer: Self-pay | Admitting: Internal Medicine

## 2023-11-03 DIAGNOSIS — E039 Hypothyroidism, unspecified: Secondary | ICD-10-CM | POA: Diagnosis not present

## 2023-11-03 DIAGNOSIS — E7800 Pure hypercholesterolemia, unspecified: Secondary | ICD-10-CM | POA: Diagnosis not present

## 2023-11-03 DIAGNOSIS — E7849 Other hyperlipidemia: Secondary | ICD-10-CM | POA: Diagnosis not present

## 2023-11-03 DIAGNOSIS — Z0001 Encounter for general adult medical examination with abnormal findings: Secondary | ICD-10-CM | POA: Diagnosis not present

## 2023-11-10 ENCOUNTER — Other Ambulatory Visit: Payer: Self-pay | Admitting: Internal Medicine

## 2023-11-10 DIAGNOSIS — E039 Hypothyroidism, unspecified: Secondary | ICD-10-CM | POA: Diagnosis not present

## 2023-11-10 DIAGNOSIS — E782 Mixed hyperlipidemia: Secondary | ICD-10-CM | POA: Diagnosis not present

## 2023-11-10 DIAGNOSIS — I2510 Atherosclerotic heart disease of native coronary artery without angina pectoris: Secondary | ICD-10-CM | POA: Diagnosis not present

## 2023-11-10 DIAGNOSIS — K2100 Gastro-esophageal reflux disease with esophagitis, without bleeding: Secondary | ICD-10-CM | POA: Diagnosis not present

## 2023-11-11 ENCOUNTER — Telehealth: Admitting: Physician Assistant

## 2023-11-11 DIAGNOSIS — Z716 Tobacco abuse counseling: Secondary | ICD-10-CM | POA: Diagnosis not present

## 2023-11-11 DIAGNOSIS — F17210 Nicotine dependence, cigarettes, uncomplicated: Secondary | ICD-10-CM | POA: Diagnosis not present

## 2023-11-11 MED ORDER — VARENICLINE TARTRATE 0.5 MG PO TABS: ORAL_TABLET | ORAL | 0 refills | Status: AC

## 2023-11-11 NOTE — Progress Notes (Signed)
 Virtual Visit Consent   Kristy Heath, you are scheduled for a virtual visit with a [facility] provider today. Just as with appointments in the office, your consent must be obtained to participate. Your consent will be active for this visit and any virtual visit you may have with one of our providers in the next 365 days. If you have a MyChart account, a copy of this consent can be sent to you electronically.  As this is a virtual visit, video technology does not allow for your provider to perform a traditional examination. This may limit your provider's ability to fully assess your condition. If your provider identifies any concerns that need to be evaluated in person or the need to arrange testing (such as labs, EKG, etc.), we will make arrangements to do so. Although advances in technology are sophisticated, we cannot ensure that it will always work on either your end or our end. If the connection with a video visit is poor, the visit may have to be switched to a telephone visit. With either a video or telephone visit, we are not always able to ensure that we have a secure connection.  By engaging in this virtual visit, you consent to the provision of healthcare and authorize for your insurance to be billed (if applicable) for the services provided during this visit. Depending on your insurance coverage, you may receive a charge related to this service.  I need to obtain your verbal consent now. Are you willing to proceed with your visit today? [name] has provided verbal consent on 11/11/2023 for a virtual visit (video or telephone). Kristy Heath, [place]  Date: 11/11/2023 10:25 AM   Virtual Visit via Video Note   I, Kristy Heath, connected with  Kristy Heath  ([id number], 26-Apr-1961) on 11/11/23 at 10:00 AM EDT by a video-enabled telemedicine application and verified that I am speaking with the correct person using two identifiers.  Location: Patient: Virtual Visit Location  Patient: Home Provider: Virtual Visit Location Provider: Home Office   I discussed the limitations of evaluation and management by telemedicine and the availability of in person appointments. The patient expressed understanding and agreed to proceed.    History of Present Illness: Kristy Heath is a 63 y.o. who identifies as a female who was assigned female at birth, and is being seen today for follow-up tobacco treatment visit for smoking cessation. At last visit, patient was started on course of Wellbutrin  SR 150 mg once for 3 days before increasing to BID. Notes taking and tolerating well overall initially but after a couple of weeks, started noting it made her feel quite loopy. As such she stopped the medication with resolution of these symptoms. Notes she had mostly stopped smoking with the Wellbutrin  but has since increased back to about 5-6 cigarettes per day. Is wanting to discuss starting Chantix instead.   HPI: HPI  Problems:  Patient Active Problem List   Diagnosis Date Noted   Nicotine dependence, cigarettes, uncomplicated 11/11/2023   Long term (current) use of antithrombotics/antiplatelets 02/20/2023   CAD (coronary artery disease) 05/27/2022   Hyperlipidemia 05/27/2022   Tobacco use 05/27/2022   STEMI (ST elevation myocardial infarction) (HCC) 05/24/2022   Subclavian artery stenosis, left (HCC) 05/08/2016   Heart murmur 05/08/2016    Allergies:  Allergies  Allergen Reactions   Brilinta  [Ticagrelor ] Hives   Shellfish Allergy     hives   Medications:  Current Outpatient Medications:    aspirin  81 MG chewable tablet, Chew  1 tablet (81 mg total) by mouth daily., Disp: 90 tablet, Rfl: 2   atorvastatin  (LIPITOR ) 80 MG tablet, TAKE 1 TABLET BY MOUTH EVERY DAY, Disp: 90 tablet, Rfl: 3   buPROPion  (WELLBUTRIN  SR) 150 MG 12 hr tablet, Take 1 tab PO daily x 3 days. Then increase to 1 tab BID., Disp: 60 tablet, Rfl: 0   cetirizine (ZYRTEC) 10 MG chewable tablet, Chew 10 mg by  mouth daily., Disp: , Rfl:    conjugated estrogens (PREMARIN) vaginal cream, Place 1 Applicatorful vaginally daily as needed (discomfort)., Disp: , Rfl:    ezetimibe  (ZETIA ) 10 MG tablet, TAKE 1 TABLET BY MOUTH EVERY DAY, Disp: 90 tablet, Rfl: 2   Flaxseed, Linseed, (FLAX SEED OIL PO), Take by mouth daily., Disp: , Rfl:    hydrocortisone (ANUSOL-HC) 2.5 % rectal cream, Place 1 Application rectally 2 (two) times daily as needed for hemorrhoids., Disp: , Rfl:    levothyroxine  (SYNTHROID , LEVOTHROID) 50 MCG tablet, Take 50 mcg by mouth daily before breakfast., Disp: , Rfl:    losartan  (COZAAR ) 25 MG tablet, Take 0.5 tablets (12.5 mg total) by mouth daily., Disp: 45 tablet, Rfl: 3   metoprolol  tartrate (LOPRESSOR ) 25 MG tablet, TAKE 0.5 TABLETS BY MOUTH 2 TIMES DAILY., Disp: 90 tablet, Rfl: 0   Multiple Vitamins-Minerals (ONE A DAY WOMEN 50 PLUS PO), Take by mouth daily., Disp: , Rfl:    nitroGLYCERIN  (NITROSTAT ) 0.4 MG SL tablet, Place 1 tablet (0.4 mg total) under the tongue every 5 (five) minutes x 3 doses as needed for chest pain., Disp: 25 tablet, Rfl: 2   omeprazole (PRILOSEC) 20 MG capsule, Take 20 mg by mouth daily., Disp: , Rfl:   Observations/Objective: Patient is well-developed, well-nourished in no acute distress.  Resting comfortably  at home.  Head is normocephalic, atraumatic.  No labored breathing.  Speech is clear and coherent with logical content.  Patient is alert and oriented at baseline.   Assessment and Plan: 1. Nicotine dependence, cigarettes, uncomplicated (Primary)  Side effects to Wellbutrin . Added to allergies/intolerances list in patient's chart.  Will restart Chantix as she has taken before with success. Some mild GI upset before so will do a slower titration in dosing and monitor closing. Plan for follow-up via message in 10 days. Formal follow-up in 4 weeks.  Follow Up Instructions: I discussed the assessment and treatment plan with the patient. The patient was  provided an opportunity to ask questions and all were answered. The patient agreed with the plan and demonstrated an understanding of the instructions.  A copy of instructions were sent to the patient via MyChart unless otherwise noted below.   The patient was advised to call back or seek an in-person evaluation if the symptoms worsen or if the condition fails to improve as anticipated.    Kristy Velma Lunger, PA-C

## 2023-11-11 NOTE — Patient Instructions (Addendum)
 Elveria Britain, thank you for joining Elsie Velma Lunger, PA-C for today's virtual visit.  While this provider is not your primary care provider (PCP), if your PCP is located in our provider database this encounter information will be shared with them immediately following your visit.  Consent: (Patient) Kristy Heath provided verbal consent for this virtual visit at the beginning of the encounter.  Current Medications:  Current Outpatient Medications:    aspirin  81 MG chewable tablet, Chew 1 tablet (81 mg total) by mouth daily., Disp: 90 tablet, Rfl: 2   atorvastatin  (LIPITOR ) 80 MG tablet, TAKE 1 TABLET BY MOUTH EVERY DAY, Disp: 90 tablet, Rfl: 3   cetirizine (ZYRTEC) 10 MG chewable tablet, Chew 10 mg by mouth daily., Disp: , Rfl:    conjugated estrogens (PREMARIN) vaginal cream, Place 1 Applicatorful vaginally daily as needed (discomfort)., Disp: , Rfl:    ezetimibe  (ZETIA ) 10 MG tablet, TAKE 1 TABLET BY MOUTH EVERY DAY, Disp: 90 tablet, Rfl: 2   Flaxseed, Linseed, (FLAX SEED OIL PO), Take by mouth daily., Disp: , Rfl:    hydrocortisone (ANUSOL-HC) 2.5 % rectal cream, Place 1 Application rectally 2 (two) times daily as needed for hemorrhoids., Disp: , Rfl:    levothyroxine  (SYNTHROID , LEVOTHROID) 50 MCG tablet, Take 50 mcg by mouth daily before breakfast., Disp: , Rfl:    losartan  (COZAAR ) 25 MG tablet, Take 0.5 tablets (12.5 mg total) by mouth daily., Disp: 45 tablet, Rfl: 3   metoprolol  tartrate (LOPRESSOR ) 25 MG tablet, TAKE 0.5 TABLETS BY MOUTH 2 TIMES DAILY., Disp: 90 tablet, Rfl: 0   Multiple Vitamins-Minerals (ONE A DAY WOMEN 50 PLUS PO), Take by mouth daily., Disp: , Rfl:    nitroGLYCERIN  (NITROSTAT ) 0.4 MG SL tablet, Place 1 tablet (0.4 mg total) under the tongue every 5 (five) minutes x 3 doses as needed for chest pain., Disp: 25 tablet, Rfl: 2   omeprazole (PRILOSEC) 20 MG capsule, Take 20 mg by mouth daily., Disp: , Rfl:    When you are getting low on your smoking cessation  medications, schedule your next video appointment as previously discussed. This way we can follow-up with you, make any necessary adjustments/changes, and get next fill of medication sent in to your pharmacy.   Follow-Up: Call back or seek an in-person evaluation if the symptoms worsen or if the condition fails to improve as anticipated.  Other Instructions  Congratulations for your interest in quitting smoking!  Quitting smoking is one of the most important things you can do to protect your health.  We are here to help you! Did you know that if you quit smoking 1 pack per day you could save up to $2550.00 per year?  Medications are not appropriate for everyone depending upon your situation and any health issues you may have. If we do prescribe medication, it will be for 1 month at time and you will be required to have a follow up Video visit at 1 month to assess how you are doing and if there are any side effects.  This could be for up to a total of 3 months.    Support from your friends, family and work colleagues is very important. Please let them know that you are trying to stop smoking so that they understand your need for support in this goal!  - Remove tobacco products from your environment - Set a quit date ideally within 2 weeks. - You should totally abstain from smoking after your quit date. A single puff could  hurt your progress or cause you to relapse - If there are others in your household that smoke, ask them to try to quit or abstain from smoking in your presence  You may notice nicotine withdrawal symptoms such as increased appetite and weight gain, changes in mood, insomnia, irritability and/or anxiety. These symptoms peak in the first three days after smoking cessation and subside over the next 3-4 weeks.   I have prescribed Chantix (Varenicline) 0.5 mg tablets. Start these one week before your selected quit date. Take after eating, along with a full glass of water. To start,  take 1 tablet by mouth daily for 7 days. On Day 8, increase to 1 tablet twice daily for 7 days. Then increase to two tablets twice daily for the rest of the 12-week course of medication.  A combination of behavioral and medication can improve the success of you quitting smoking. You may want to use the 1-800-QUIT-NOW free support line.  Also, the Department of Health and Human Services provides Smoke free apps for smartphones: SharedCustomer[employer]fi  If you happen to break your plan and have a cigarette, keep taking your medications and continue to try to abstain.  Do not give up!  MAKE SURE YOU  Take any prescribed medications only as instructed.  If you miss a dose of medication, take the next dose when it is due and get back on schedule. DO NOT double up on medications.  Mark your calendar to do your Follow Up Smoking Cessation Visit in one month   If you have been instructed to have an in-person evaluation today at a local Urgent Care facility, please use the link below. It will take you to a list of all of our available [facility], including address, phone number and hours of operation. Please do not delay care.  [facility]  If you or a family member do not have a primary care provider, use the link below to schedule a visit and establish care. When you choose a [facility] primary care physician or advanced practice provider, you gain a long-term partner in health. Find a Primary Care Provider  Learn more about [facility]'s in-office and virtual care options: [facility] - Get Care Now

## 2023-11-13 ENCOUNTER — Other Ambulatory Visit: Payer: Self-pay | Admitting: Cardiology

## 2023-11-13 ENCOUNTER — Encounter: Payer: Self-pay | Admitting: Internal Medicine

## 2023-11-13 DIAGNOSIS — Z87891 Personal history of nicotine dependence: Secondary | ICD-10-CM | POA: Diagnosis not present

## 2023-11-13 DIAGNOSIS — F17210 Nicotine dependence, cigarettes, uncomplicated: Secondary | ICD-10-CM | POA: Diagnosis not present

## 2023-11-13 DIAGNOSIS — I2510 Atherosclerotic heart disease of native coronary artery without angina pectoris: Secondary | ICD-10-CM | POA: Diagnosis not present

## 2023-12-16 DIAGNOSIS — M7741 Metatarsalgia, right foot: Secondary | ICD-10-CM | POA: Diagnosis not present

## 2023-12-16 DIAGNOSIS — M79671 Pain in right foot: Secondary | ICD-10-CM | POA: Diagnosis not present

## 2024-01-26 IMAGING — MG MM DIGITAL SCREENING BILAT W/ TOMO AND CAD
8 series · 9 of 24 positions shown · non-contrast
Comparison: Previous exam(s).

CLINICAL DATA: Screening.

EXAM:
DIGITAL SCREENING BILATERAL MAMMOGRAM WITH TOMOSYNTHESIS AND CAD
TECHNIQUE: Bilateral screening digital craniocaudal and mediolateral oblique
mammograms were obtained. Bilateral screening digital breast
tomosynthesis was performed. The images were evaluated with
computer-aided detection.

[R MLO synth-2D]
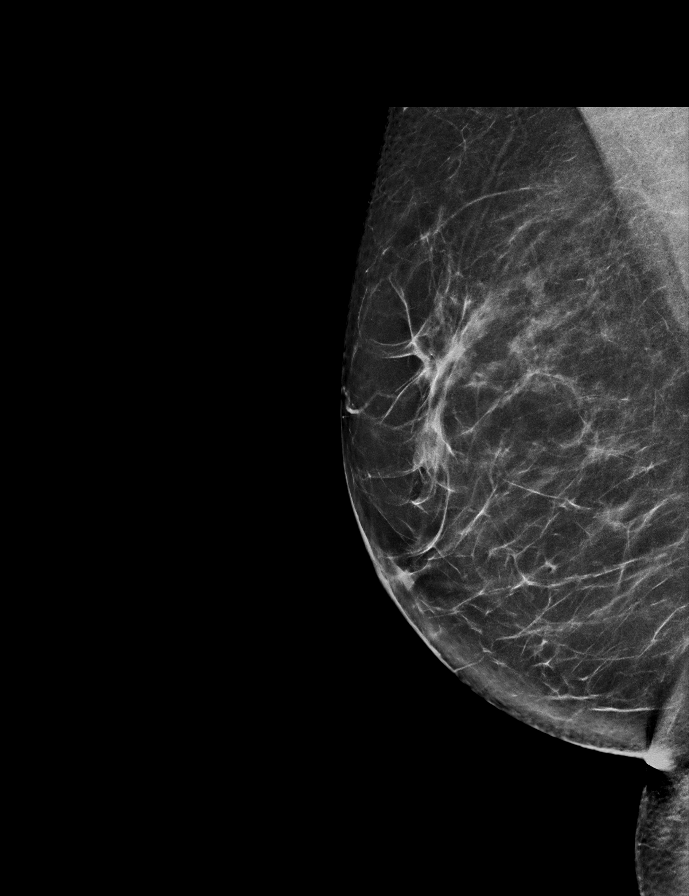

[L CC synth-2D]
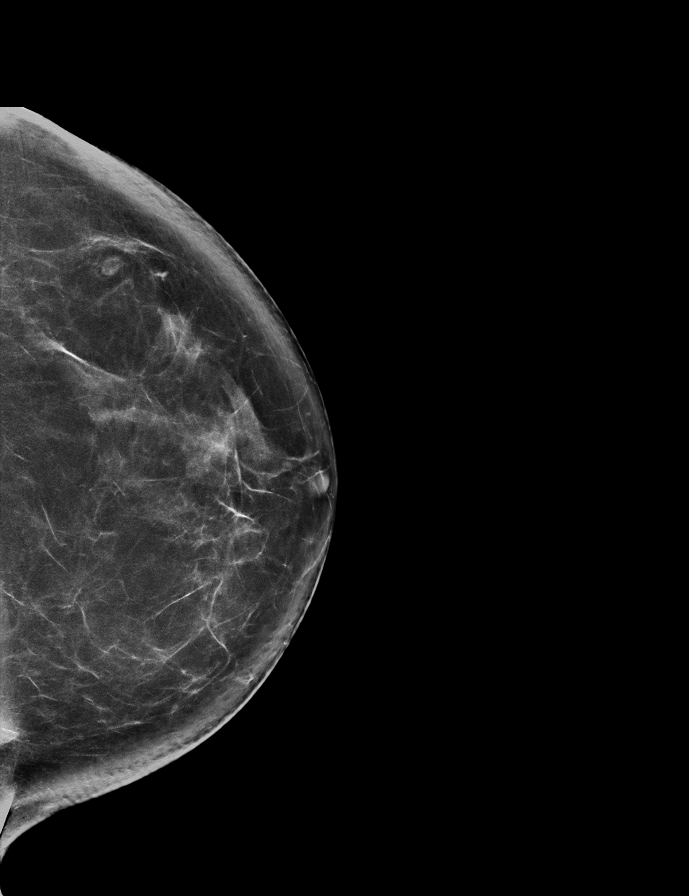

[R CC synth-2D]
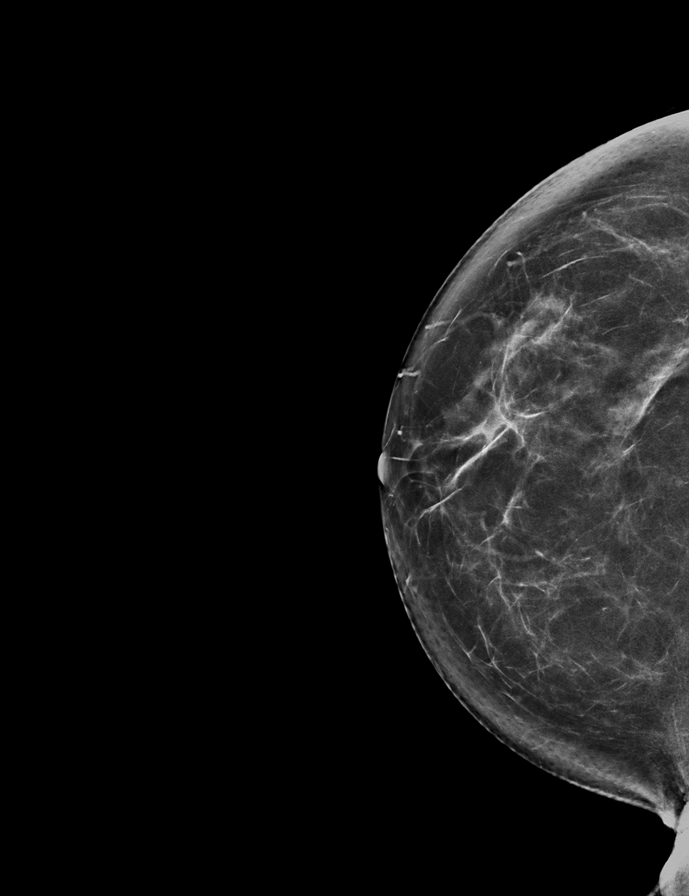

[L MLO synth-2D]
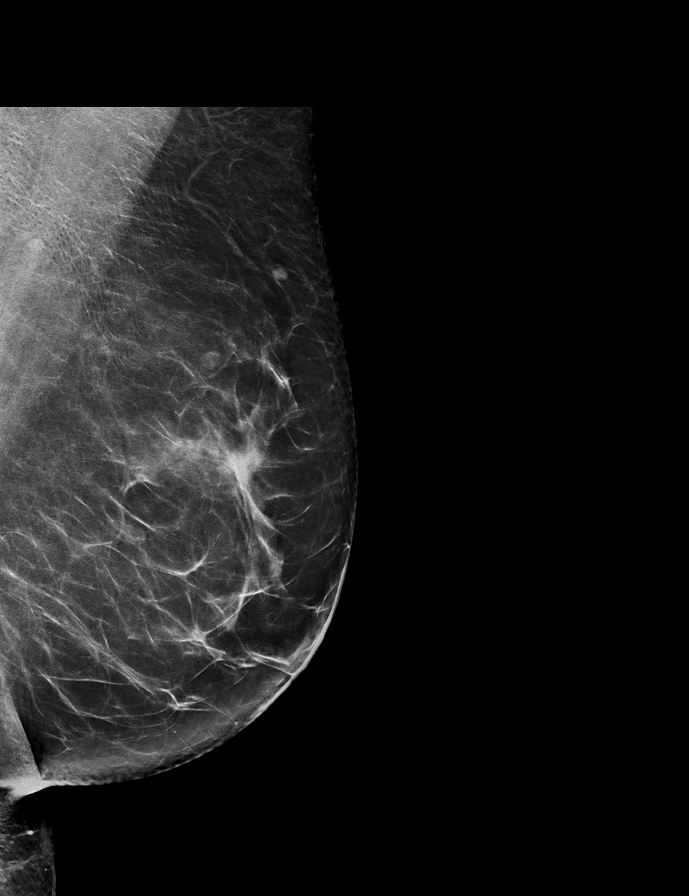

[R MLO tomo · 2 of 72 frames shown]
[frame 24/72]
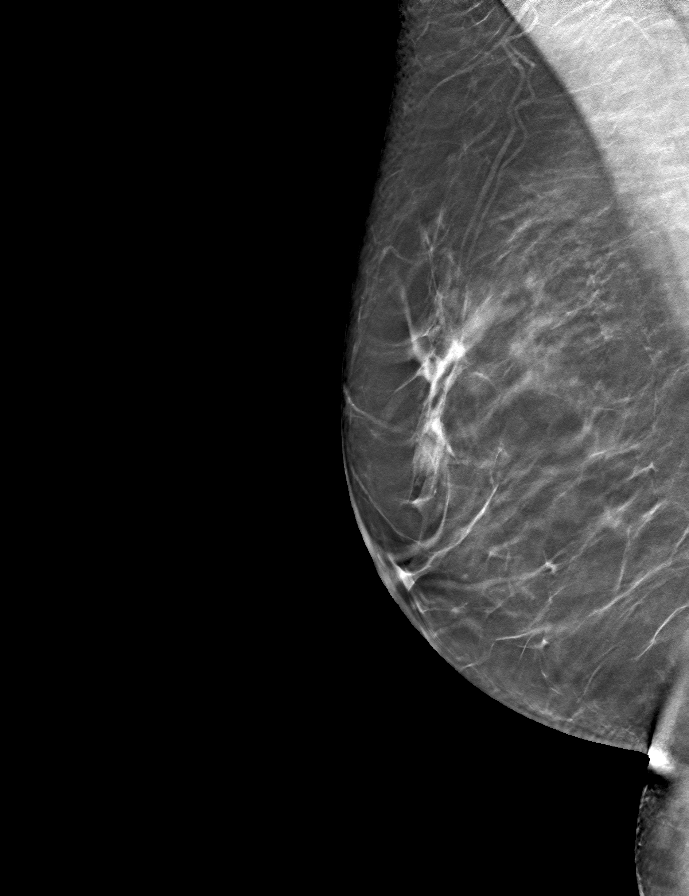
[frame 37/72]
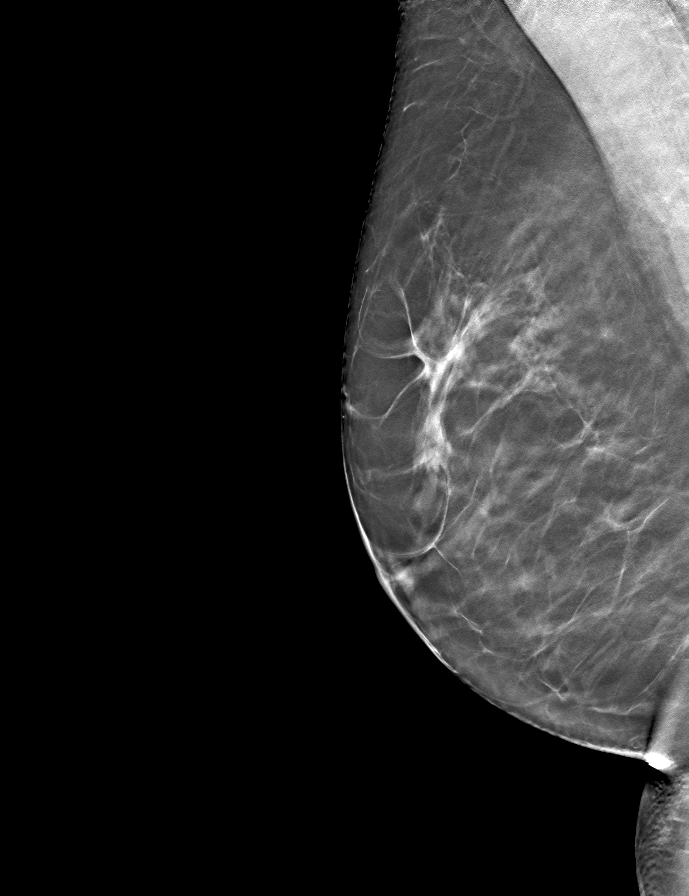

[L CC tomo · tomo slice 41/82.0]
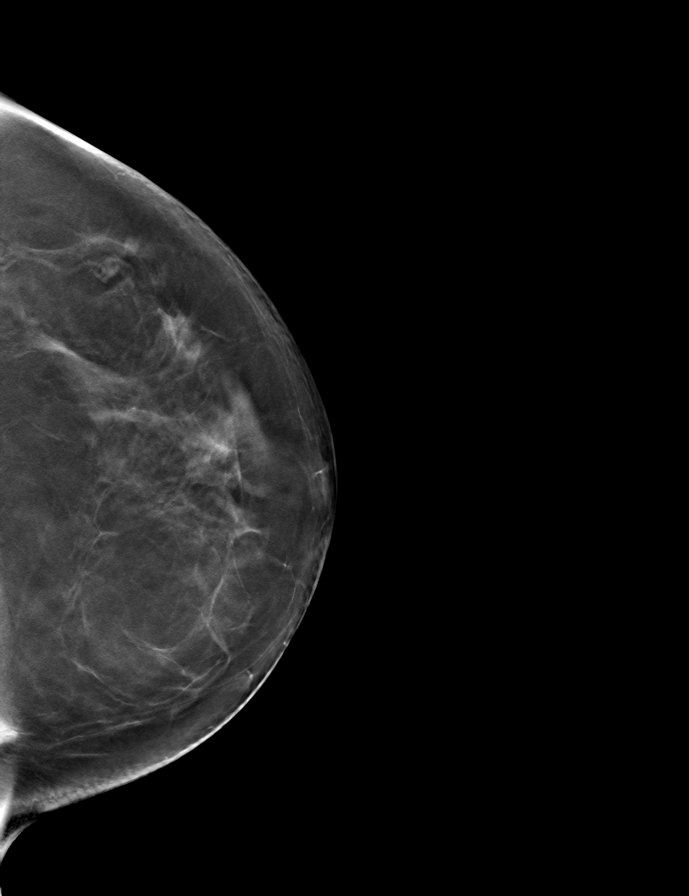

[L MLO tomo · tomo slice 40/79.0]
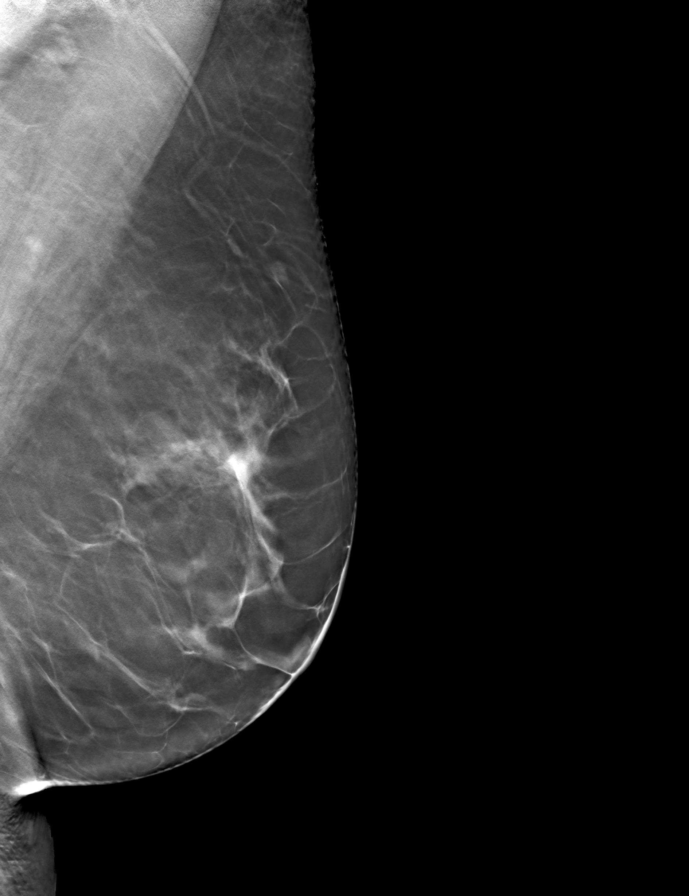

[R CC tomo · tomo slice 39/77.0]
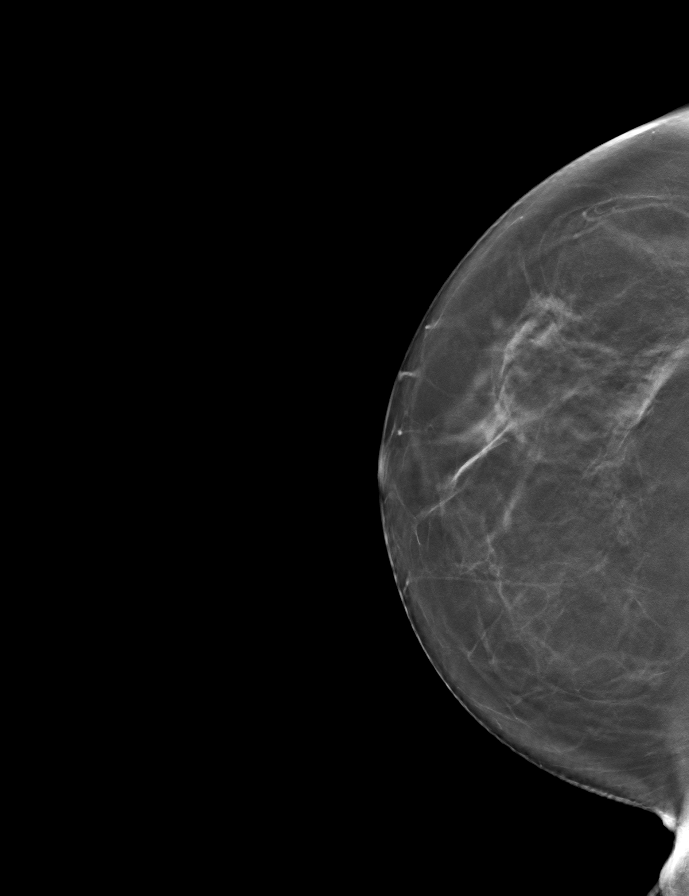

[9 of 24 positions shown; findings below may reference images not displayed]

ACR Breast Density Category b: There are scattered areas of
fibroglandular density.
FINDINGS: There are no findings suspicious for malignancy.
IMPRESSION: No mammographic evidence of malignancy. A result letter of this
screening mammogram will be mailed directly to the patient.

RECOMMENDATION:
Screening mammogram in one year. (Code:51-O-LD2)

BI-RADS CATEGORY  1: Negative.

## 2024-02-10 ENCOUNTER — Other Ambulatory Visit: Payer: Self-pay | Admitting: Internal Medicine

## 2024-05-08 ENCOUNTER — Other Ambulatory Visit: Payer: Self-pay | Admitting: Internal Medicine
